# Patient Record
Sex: Male | Born: 1950
Health system: Southern US, Community
[De-identification: ages and names within clinical notes are randomized; demographics above are authoritative.]

## PROBLEM LIST (undated history)

## (undated) DIAGNOSIS — I509 Heart failure, unspecified: Secondary | ICD-10-CM

## (undated) DIAGNOSIS — I10 Essential (primary) hypertension: Secondary | ICD-10-CM

## (undated) DIAGNOSIS — M109 Gout, unspecified: Secondary | ICD-10-CM

## (undated) DIAGNOSIS — E785 Hyperlipidemia, unspecified: Secondary | ICD-10-CM

## (undated) DIAGNOSIS — R011 Cardiac murmur, unspecified: Secondary | ICD-10-CM

## (undated) HISTORY — DX: Hyperlipidemia, unspecified: E78.5

## (undated) HISTORY — DX: Cardiac murmur, unspecified: R01.1

## (undated) HISTORY — PX: NO PAST SURGERIES: SHX2092

## (undated) HISTORY — DX: Essential (primary) hypertension: I10

---

## 1998-04-01 ENCOUNTER — Other Ambulatory Visit: Admission: RE | Admit: 1998-04-01 | Discharge: 1998-04-01 | Payer: Self-pay | Admitting: Family Medicine

## 2004-06-16 ENCOUNTER — Ambulatory Visit: Payer: Self-pay | Admitting: *Deleted

## 2004-07-07 ENCOUNTER — Ambulatory Visit: Payer: Self-pay | Admitting: Nurse Practitioner

## 2004-07-15 ENCOUNTER — Ambulatory Visit: Payer: Self-pay | Admitting: *Deleted

## 2004-08-29 ENCOUNTER — Ambulatory Visit: Payer: Self-pay | Admitting: Nurse Practitioner

## 2005-04-21 ENCOUNTER — Ambulatory Visit: Payer: Self-pay | Admitting: Nurse Practitioner

## 2005-04-27 ENCOUNTER — Ambulatory Visit: Payer: Self-pay | Admitting: Nurse Practitioner

## 2005-05-04 ENCOUNTER — Ambulatory Visit: Payer: Self-pay | Admitting: Nurse Practitioner

## 2005-05-11 ENCOUNTER — Ambulatory Visit: Payer: Self-pay | Admitting: Nurse Practitioner

## 2005-06-03 ENCOUNTER — Ambulatory Visit: Payer: Self-pay | Admitting: Nurse Practitioner

## 2005-06-17 ENCOUNTER — Ambulatory Visit: Payer: Self-pay | Admitting: Nurse Practitioner

## 2005-11-09 ENCOUNTER — Ambulatory Visit: Payer: Self-pay | Admitting: Nurse Practitioner

## 2006-04-12 ENCOUNTER — Ambulatory Visit: Payer: Self-pay | Admitting: Nurse Practitioner

## 2006-04-16 ENCOUNTER — Encounter: Admission: RE | Admit: 2006-04-16 | Discharge: 2006-04-16 | Payer: Self-pay | Admitting: Family Medicine

## 2006-04-20 ENCOUNTER — Encounter: Admission: RE | Admit: 2006-04-20 | Discharge: 2006-04-20 | Payer: Self-pay | Admitting: Family Medicine

## 2006-09-01 ENCOUNTER — Ambulatory Visit: Payer: Self-pay | Admitting: Nurse Practitioner

## 2006-09-30 ENCOUNTER — Ambulatory Visit: Payer: Self-pay | Admitting: Nurse Practitioner

## 2006-10-13 ENCOUNTER — Ambulatory Visit: Payer: Self-pay | Admitting: Nurse Practitioner

## 2006-10-15 IMAGING — CR DG ANKLE COMPLETE 3+V*R*
3 series · 3 of 3 positions shown · non-contrast
Comparison: none

CLINICAL DATA: Pain and swelling with decreased range of motion. 
RIGHT ANKLE:

[view not recorded (1 of 3)]
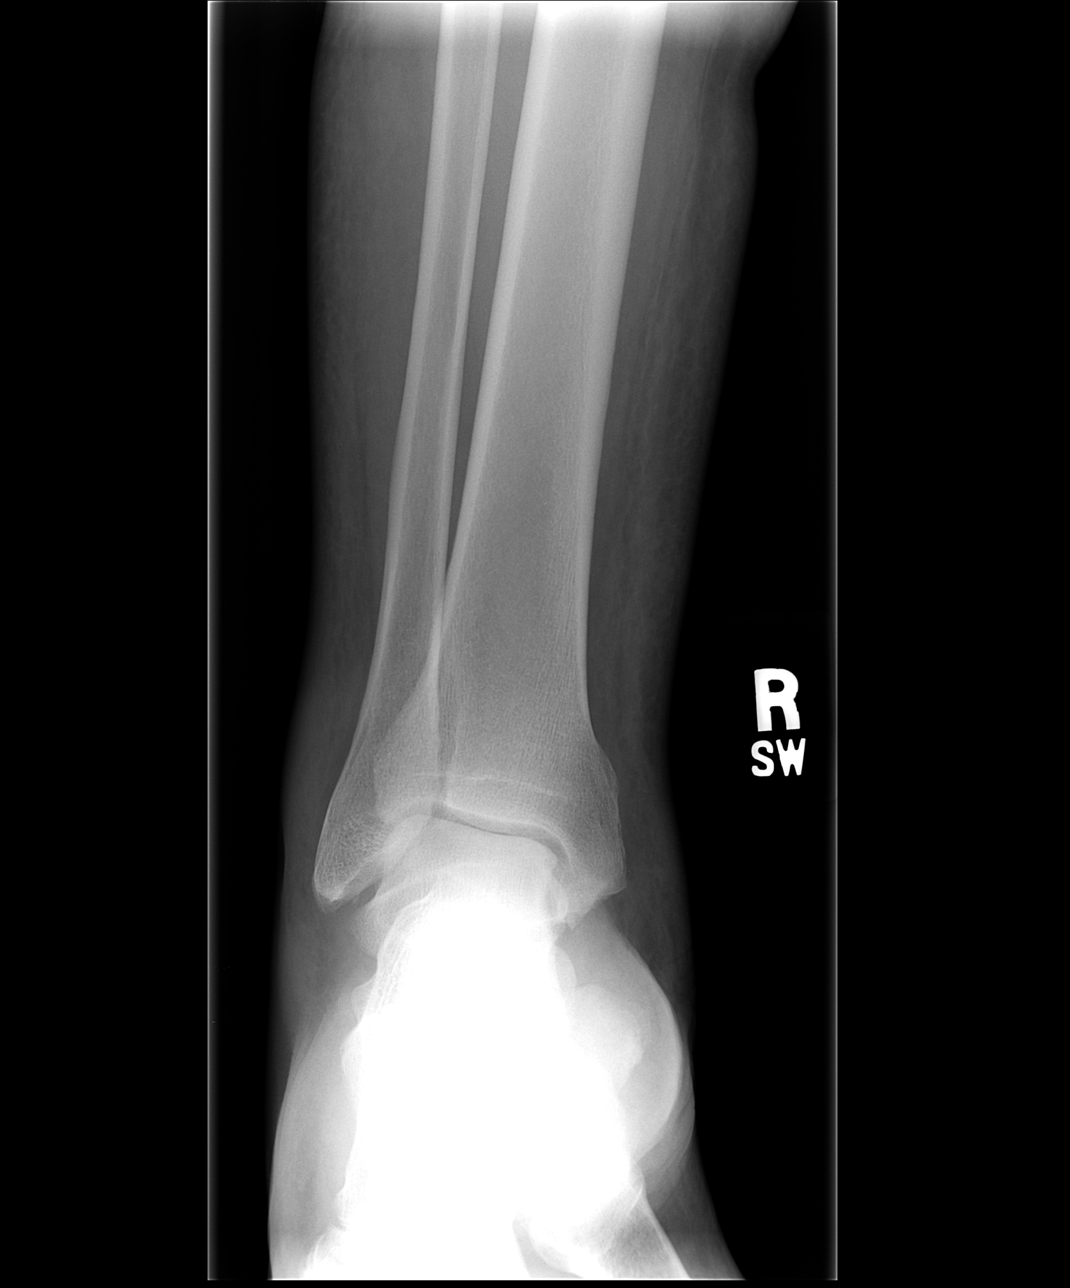

[view not recorded (2 of 3)]
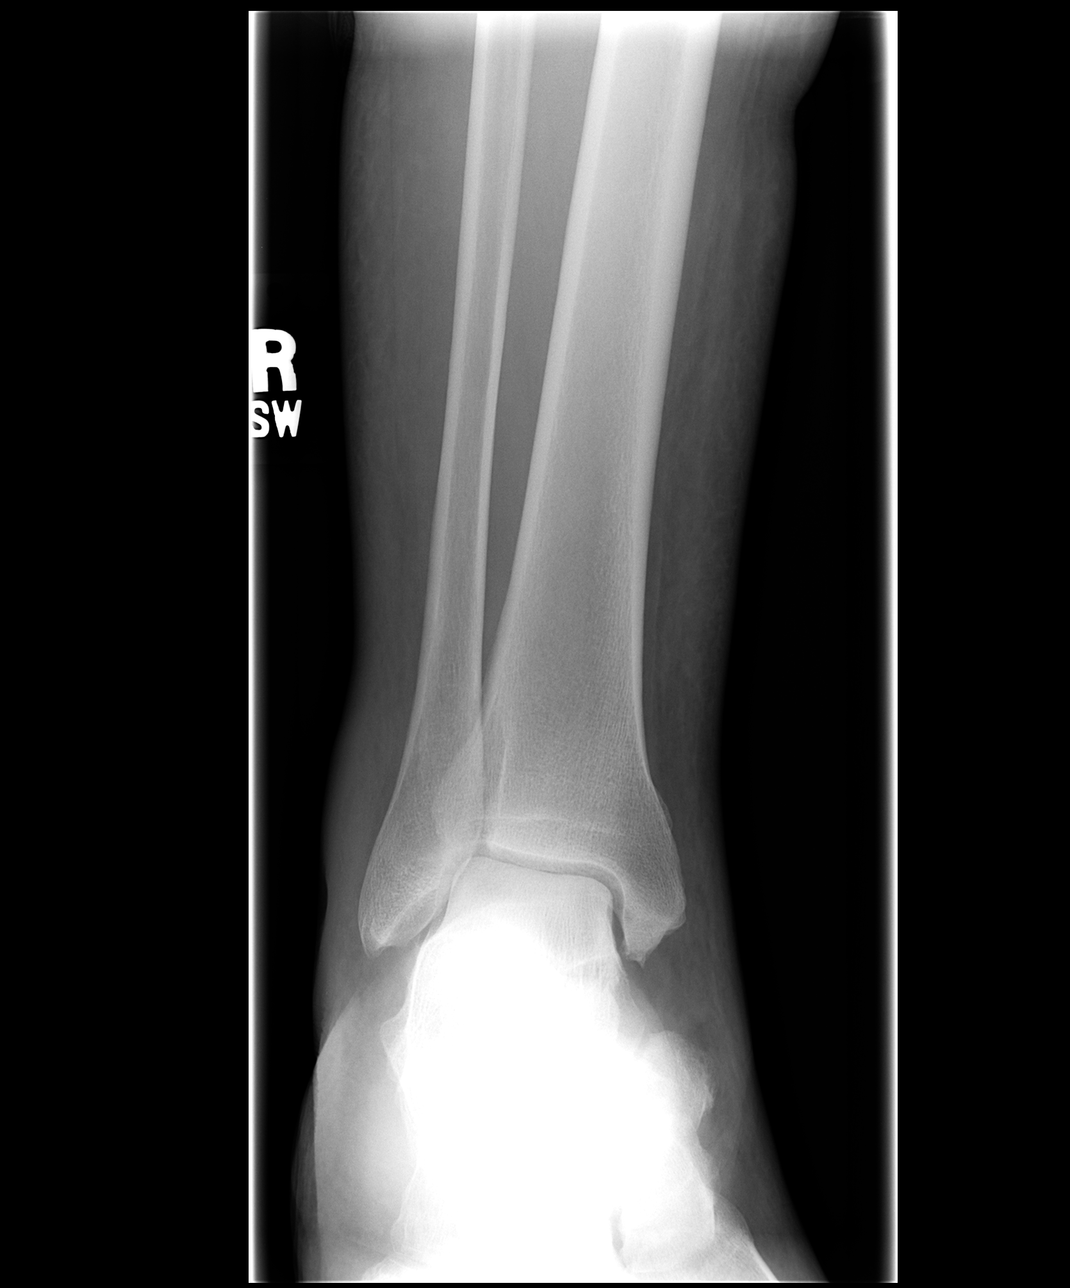

[view not recorded (3 of 3)]
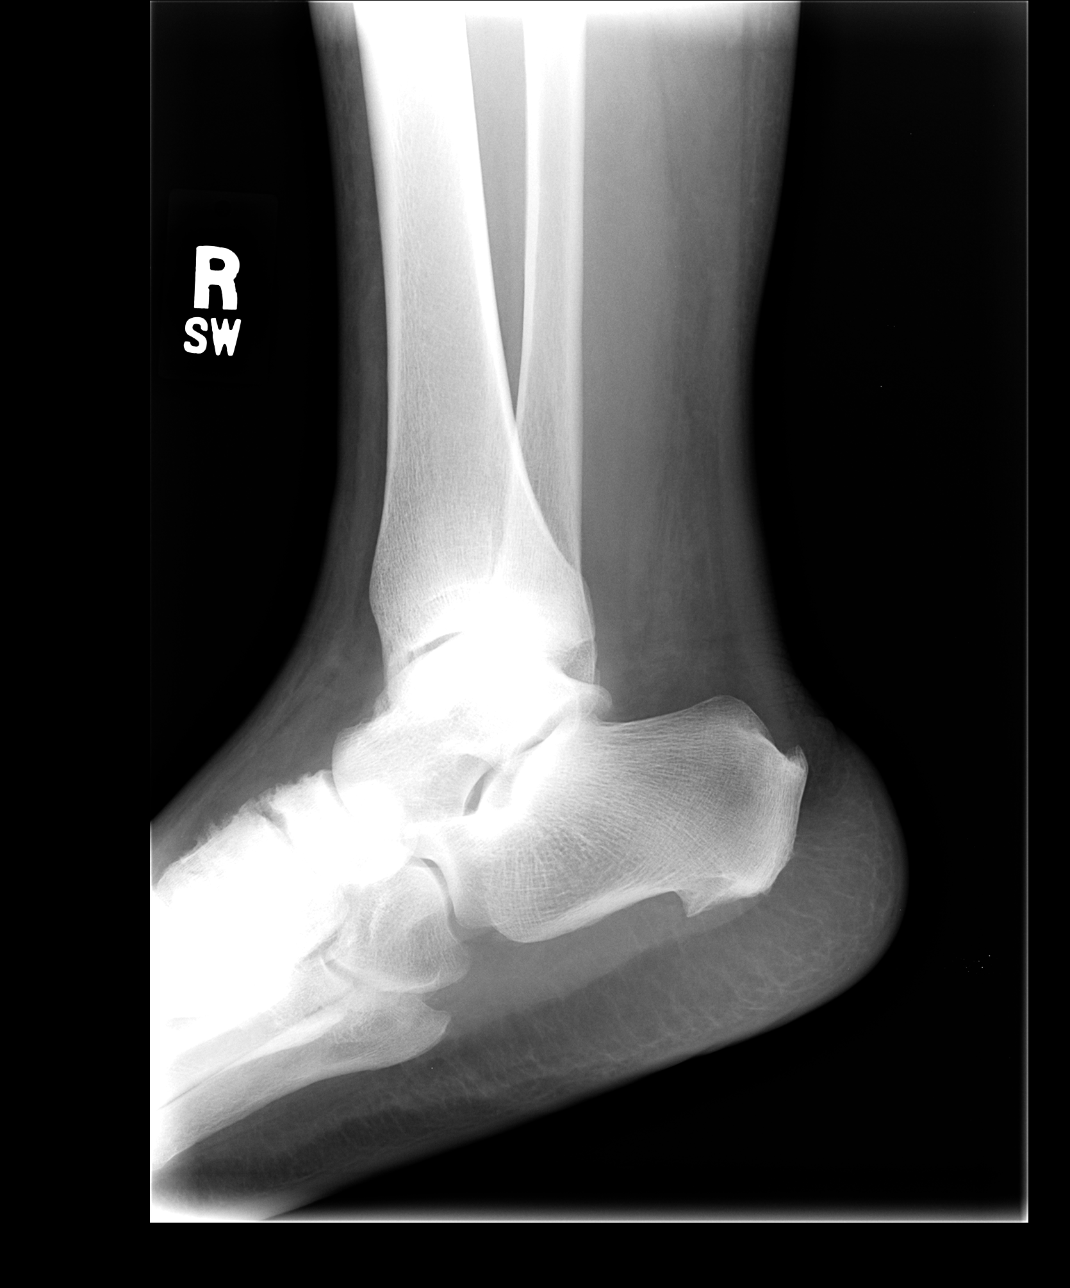

[3 of 3 positions shown; findings below may reference images not displayed]

FINDINGS: Three views of the ankle demonstrate soft tissue swelling particularly along the lateral malleolus.  Negative for acute fracture or dislocation.  Spurring or osteophyte formation along the medial malleolus.  There is also mild spurring near the insertion of the Achilles tendon and along the plantar aspect of the calcaneus.  Irregularity along the dorsal aspect of the tarsal bones is likely degenerative.
IMPRESSION: 1.  Soft tissue swelling without acute bone abnormality. 
2.  Degenerative changes in the foot.

## 2007-06-08 ENCOUNTER — Encounter (INDEPENDENT_AMBULATORY_CARE_PROVIDER_SITE_OTHER): Payer: Self-pay | Admitting: *Deleted

## 2007-09-08 ENCOUNTER — Ambulatory Visit: Payer: Self-pay | Admitting: Internal Medicine

## 2007-10-05 ENCOUNTER — Ambulatory Visit: Payer: Self-pay | Admitting: Family Medicine

## 2007-10-19 ENCOUNTER — Ambulatory Visit: Payer: Self-pay | Admitting: Family Medicine

## 2007-10-19 ENCOUNTER — Encounter (INDEPENDENT_AMBULATORY_CARE_PROVIDER_SITE_OTHER): Payer: Self-pay | Admitting: Family Medicine

## 2007-10-19 LAB — CONVERTED CEMR LAB
ALT: 29 units/L (ref 0–53)
AST: 17 units/L (ref 0–37)
Albumin: 4.4 g/dL (ref 3.5–5.2)
Alcohol, Ethyl (B): <5 mg/dL (ref 0–10)
Alkaline Phosphatase: 51 units/L (ref 39–117)
Amphetamine Screen, Ur: NEGATIVE
BUN: 16 mg/dL (ref 6–23)
Barbiturate Quant, Ur: NEGATIVE
Benzodiazepines.: NEGATIVE
CO2: 27 meq/L (ref 19–32)
Calcium: 9.8 mg/dL (ref 8.4–10.5)
Chloride: 106 meq/L (ref 96–112)
Cocaine Metabolites: NEGATIVE
Creatinine, Ser: 0.84 mg/dL (ref 0.40–1.50)
Creatinine,U: 82.7 mg/dL
Glucose, Bld: 122 mg/dL — ABNORMAL HIGH (ref 70–99)
Marijuana Metabolite: NEGATIVE
Methadone: NEGATIVE
Opiate Screen, Urine: NEGATIVE
Phencyclidine (PCP): NEGATIVE
Potassium: 4.2 meq/L (ref 3.5–5.3)
Propoxyphene: NEGATIVE
Sodium: 144 meq/L (ref 135–145)
Total Bilirubin: 0.6 mg/dL (ref 0.3–1.2)
Total Protein: 7 g/dL (ref 6.0–8.3)

## 2007-12-21 ENCOUNTER — Ambulatory Visit: Payer: Self-pay | Admitting: Family Medicine

## 2008-06-14 ENCOUNTER — Ambulatory Visit: Payer: Self-pay | Admitting: Internal Medicine

## 2008-11-28 ENCOUNTER — Encounter (INDEPENDENT_AMBULATORY_CARE_PROVIDER_SITE_OTHER): Payer: Self-pay | Admitting: Internal Medicine

## 2008-11-28 ENCOUNTER — Encounter: Payer: Self-pay | Admitting: Internal Medicine

## 2008-11-28 ENCOUNTER — Ambulatory Visit: Payer: Self-pay | Admitting: Internal Medicine

## 2008-11-28 LAB — CONVERTED CEMR LAB
ALT: 28 units/L (ref 0–53)
AST: 20 units/L (ref 0–37)
Albumin: 4.3 g/dL (ref 3.5–5.2)
Alkaline Phosphatase: 65 units/L (ref 39–117)
BUN: 14 mg/dL (ref 6–23)
CO2: 23 meq/L (ref 19–32)
Calcium: 9.1 mg/dL (ref 8.4–10.5)
Chloride: 107 meq/L (ref 96–112)
Creatinine, Ser: 0.73 mg/dL (ref 0.40–1.50)
Glucose, Bld: 141 mg/dL — ABNORMAL HIGH (ref 70–99)
Potassium: 3.9 meq/L (ref 3.5–5.3)
Sodium: 142 meq/L (ref 135–145)
Total Bilirubin: 0.5 mg/dL (ref 0.3–1.2)
Total Protein: 7.1 g/dL (ref 6.0–8.3)

## 2008-11-30 ENCOUNTER — Ambulatory Visit (HOSPITAL_COMMUNITY): Admission: RE | Admit: 2008-11-30 | Discharge: 2008-11-30 | Payer: Self-pay | Admitting: Internal Medicine

## 2008-12-12 ENCOUNTER — Encounter (INDEPENDENT_AMBULATORY_CARE_PROVIDER_SITE_OTHER): Payer: Self-pay | Admitting: Adult Health

## 2008-12-12 ENCOUNTER — Ambulatory Visit: Payer: Self-pay | Admitting: Internal Medicine

## 2008-12-12 LAB — CONVERTED CEMR LAB
Cholesterol: 168 mg/dL (ref 0–200)
HDL: 49 mg/dL (ref >39)
LDL Cholesterol: 101 mg/dL — ABNORMAL HIGH (ref 0–99)
Total CHOL/HDL Ratio: 3.4
Triglycerides: 89 mg/dL (ref <150)
VLDL: 18 mg/dL (ref 0–40)

## 2009-02-01 ENCOUNTER — Other Ambulatory Visit: Admission: RE | Admit: 2009-02-01 | Discharge: 2009-02-01 | Payer: Self-pay | Admitting: Internal Medicine

## 2009-02-01 ENCOUNTER — Ambulatory Visit: Payer: Self-pay | Admitting: Internal Medicine

## 2009-02-22 ENCOUNTER — Ambulatory Visit: Payer: Self-pay | Admitting: Internal Medicine

## 2009-03-14 ENCOUNTER — Ambulatory Visit: Payer: Self-pay | Admitting: Internal Medicine

## 2009-03-14 ENCOUNTER — Encounter: Payer: Self-pay | Admitting: Internal Medicine

## 2009-04-10 DIAGNOSIS — E1159 Type 2 diabetes mellitus with other circulatory complications: Secondary | ICD-10-CM | POA: Insufficient documentation

## 2009-04-10 DIAGNOSIS — I10 Essential (primary) hypertension: Secondary | ICD-10-CM | POA: Insufficient documentation

## 2009-04-13 ENCOUNTER — Encounter: Payer: Self-pay | Admitting: Internal Medicine

## 2009-05-10 DIAGNOSIS — I498 Other specified cardiac arrhythmias: Secondary | ICD-10-CM | POA: Insufficient documentation

## 2009-06-20 ENCOUNTER — Encounter (INDEPENDENT_AMBULATORY_CARE_PROVIDER_SITE_OTHER): Payer: Self-pay | Admitting: Internal Medicine

## 2009-06-20 ENCOUNTER — Ambulatory Visit: Payer: Self-pay | Admitting: Internal Medicine

## 2009-06-20 LAB — CONVERTED CEMR LAB
ALT: 30 units/L (ref 0–53)
AST: 19 units/L (ref 0–37)
Albumin: 4.5 g/dL (ref 3.5–5.2)
Alkaline Phosphatase: 60 units/L (ref 39–117)
BUN: 10 mg/dL (ref 6–23)
CO2: 24 meq/L (ref 19–32)
Calcium: 9.5 mg/dL (ref 8.4–10.5)
Chloride: 103 meq/L (ref 96–112)
Cholesterol: 149 mg/dL (ref 0–200)
Creatinine, Ser: 0.68 mg/dL (ref 0.40–1.50)
Glucose, Bld: 122 mg/dL — ABNORMAL HIGH (ref 70–99)
HDL: 41 mg/dL (ref >39)
LDL Cholesterol: 83 mg/dL (ref 0–99)
Potassium: 3.7 meq/L (ref 3.5–5.3)
Sodium: 143 meq/L (ref 135–145)
Total Bilirubin: 0.4 mg/dL (ref 0.3–1.2)
Total CHOL/HDL Ratio: 3.6
Total Protein: 7.3 g/dL (ref 6.0–8.3)
Triglycerides: 125 mg/dL (ref <150)
VLDL: 25 mg/dL (ref 0–40)

## 2009-07-11 ENCOUNTER — Ambulatory Visit: Payer: Self-pay | Admitting: Internal Medicine

## 2010-08-13 ENCOUNTER — Encounter (INDEPENDENT_AMBULATORY_CARE_PROVIDER_SITE_OTHER): Payer: Self-pay | Admitting: *Deleted

## 2010-08-13 LAB — CONVERTED CEMR LAB: Microalb, Ur: 3.68 mg/dL — ABNORMAL HIGH (ref 0.00–1.89)

## 2010-09-03 ENCOUNTER — Ambulatory Visit: Admit: 2010-09-03 | Payer: Self-pay | Admitting: Family Medicine

## 2010-09-03 ENCOUNTER — Ambulatory Visit (HOSPITAL_COMMUNITY): Admission: RE | Admit: 2010-09-03 | Payer: Self-pay | Source: Home / Self Care | Admitting: Family Medicine

## 2010-09-18 ENCOUNTER — Encounter (INDEPENDENT_AMBULATORY_CARE_PROVIDER_SITE_OTHER): Payer: Self-pay | Admitting: *Deleted

## 2010-09-18 LAB — CONVERTED CEMR LAB
ALT: 15 units/L (ref 0–53)
AST: 16 units/L (ref 0–37)
Albumin: 4.7 g/dL (ref 3.5–5.2)
Alkaline Phosphatase: 55 units/L (ref 39–117)
BUN: 11 mg/dL (ref 6–23)
CO2: 27 meq/L (ref 19–32)
Calcium: 10.3 mg/dL (ref 8.4–10.5)
Chloride: 103 meq/L (ref 96–112)
Cholesterol: 116 mg/dL (ref 0–200)
Creatinine, Ser: 0.67 mg/dL (ref 0.40–1.50)
Glucose, Bld: 140 mg/dL — ABNORMAL HIGH (ref 70–99)
HDL: 39 mg/dL — ABNORMAL LOW (ref >39)
LDL Cholesterol: 64 mg/dL (ref 0–99)
PSA: 0.51 ng/mL (ref <=4.00)
Potassium: 4.3 meq/L (ref 3.5–5.3)
Sodium: 141 meq/L (ref 135–145)
Total Bilirubin: 0.5 mg/dL (ref 0.3–1.2)
Total CHOL/HDL Ratio: 3
Total Protein: 7.5 g/dL (ref 6.0–8.3)
Triglycerides: 66 mg/dL (ref <150)
VLDL: 13 mg/dL (ref 0–40)

## 2010-10-12 ENCOUNTER — Encounter: Payer: Self-pay | Admitting: Family Medicine

## 2010-10-23 NOTE — Consult Note (Signed)
Summary: [facility] Ref[facility]  [facility] Ref[facility]   Imported By: Roderic Ovens 04/30/2009 14:55:07  _____________________________________________________________________  External Attachment:    Type:   Image     Comment:   External Document

## 2010-10-23 NOTE — Progress Notes (Signed)
Summary: [facility]  [facility]   Imported By: Roderic Ovens 04/30/2009 15:05:39  _____________________________________________________________________  External Attachment:    Type:   Image     Comment:   External Document

## 2010-10-23 NOTE — Miscellaneous (Signed)
Summary: VIP  Patient: Seth Sanders [facility] Note: All result statuses are Final unless otherwise noted.  Tests: (1) VIP (Medications)   LLIMPORTMEDS              "Result Below..."       RESULT: MONOPRIL TABS 10 MG*TAKE TWO (2) TABLETS BY MOUTH DAILY*05/12/2007*Last Refill: 06/09/2007*[id number]*******   LLIMPORTMEDS              "Result Below..."       RESULT: INDOMETHACIN CAPS 25 MG*TAKE ONE (1) CAPSULE THREE (3) TIMES EACH  DAY AS NEEDED*09/01/2006*Last Refill: 06/06/2007*[id number]*******   LLIMPORTMEDS              "Result Below..."       RESULT: HYDROCHLOROTHIAZIDE TABS 25 MG*TAKE ONE (1) TABLET EACH DAY*04/11/2007*Last Refill: 06/09/2007*[id number]*******   LLIMPORTMEDS              "Result Below..."       RESULT: COLCHICINE TABS 0.6 MG*TAKE ONE (1) TABLET TWICE DAILY*09/01/2006*Last Refill: 12/10/2006*[id number]*******   LLIMPORTMEDS              "Result Below..."       RESULT: CLONIDINE HCL TABS 0.2 MG*TAKE ONE (1) TABLET THREE TIMES DAILY*04/11/2007*Last Refill: 06/09/2007*[id number]*******   LLIMPORTMEDS              "Result Below..."       RESULT: CALAN SR TBCR 180 MG*TAKE TWO (2) TABLETS BY MOUTH DAILY*04/11/2007*Last Refill: 06/09/2007*[id number]*******   LLIMPORTMEDS              "Result Below..."       RESULT: ATENOLOL TABS 50 MG*TAKE ONE (1) TABLET EACH DAY*04/11/2007*Last Refill: 06/09/2007*2012*******   LLIMPORTMEDS              "Result Below..."       RESULT: ALLOPURINOL TABS 300 MG*TAKE ONE (1) TABLET EACH DAY*09/01/2006*Last Refill: 06/06/2007*[id number]*******   LLIMPORTALLS              NKDA***  Note: An exclamation mark (!) indicates a result that was not dispersed into the flowsheet. Document Creation Date: 07/21/2007 3:06 PM _______________________________________________________________________  (1) Order result status: Final Collection or observation date-time: 06/08/2007 Requested date-time: 06/08/2007 Receipt date-time:  Reported date-time: 06/08/2007 Referring Physician:   Ordering Physician:    Specimen Source:  Source: [place] Order Number:  Lab site:

## 2011-08-25 ENCOUNTER — Ambulatory Visit (HOSPITAL_BASED_OUTPATIENT_CLINIC_OR_DEPARTMENT_OTHER): Payer: Self-pay

## 2012-03-30 ENCOUNTER — Ambulatory Visit (HOSPITAL_COMMUNITY): Admission: RE | Admit: 2012-03-30 | Payer: Self-pay | Source: Ambulatory Visit

## 2012-04-06 ENCOUNTER — Ambulatory Visit (HOSPITAL_COMMUNITY): Admission: RE | Admit: 2012-04-06 | Payer: Self-pay | Source: Ambulatory Visit

## 2012-04-12 ENCOUNTER — Ambulatory Visit (HOSPITAL_COMMUNITY)
Admission: RE | Admit: 2012-04-12 | Discharge: 2012-04-12 | Disposition: A | Discharge status: Home / Self Care | Payer: Self-pay | Source: Ambulatory Visit | Attending: Family Medicine | Admitting: Family Medicine

## 2012-04-12 DIAGNOSIS — I10 Essential (primary) hypertension: Secondary | ICD-10-CM | POA: Insufficient documentation

## 2012-04-12 DIAGNOSIS — R011 Cardiac murmur, unspecified: Secondary | ICD-10-CM | POA: Insufficient documentation

## 2012-04-12 DIAGNOSIS — I079 Rheumatic tricuspid valve disease, unspecified: Secondary | ICD-10-CM | POA: Insufficient documentation

## 2012-04-12 DIAGNOSIS — F17200 Nicotine dependence, unspecified, uncomplicated: Secondary | ICD-10-CM | POA: Insufficient documentation

## 2012-04-12 DIAGNOSIS — E119 Type 2 diabetes mellitus without complications: Secondary | ICD-10-CM | POA: Insufficient documentation

## 2012-09-08 ENCOUNTER — Ambulatory Visit (INDEPENDENT_AMBULATORY_CARE_PROVIDER_SITE_OTHER): Payer: Self-pay | Admitting: Family Medicine

## 2012-09-08 ENCOUNTER — Encounter: Payer: Self-pay | Admitting: Family Medicine

## 2012-09-08 VITALS — BP 182/100 | HR 92 | Temp 99.3°F | Ht 72.0 in | Wt 295.0 lb

## 2012-09-08 DIAGNOSIS — E785 Hyperlipidemia, unspecified: Secondary | ICD-10-CM | POA: Insufficient documentation

## 2012-09-08 DIAGNOSIS — E1169 Type 2 diabetes mellitus with other specified complication: Secondary | ICD-10-CM | POA: Insufficient documentation

## 2012-09-08 DIAGNOSIS — I10 Essential (primary) hypertension: Secondary | ICD-10-CM

## 2012-09-08 DIAGNOSIS — I509 Heart failure, unspecified: Secondary | ICD-10-CM

## 2012-09-08 DIAGNOSIS — E118 Type 2 diabetes mellitus with unspecified complications: Secondary | ICD-10-CM | POA: Insufficient documentation

## 2012-09-08 DIAGNOSIS — E119 Type 2 diabetes mellitus without complications: Secondary | ICD-10-CM

## 2012-09-08 DIAGNOSIS — I5030 Unspecified diastolic (congestive) heart failure: Secondary | ICD-10-CM | POA: Insufficient documentation

## 2012-09-08 LAB — POCT GLYCOSYLATED HEMOGLOBIN (HGB A1C): Hemoglobin A1C: 5.6

## 2012-09-08 MED ORDER — LISINOPRIL 40 MG PO TABS: 40.0000 mg | ORAL_TABLET | Freq: Every day | ORAL | Status: DC

## 2012-09-08 MED ORDER — PRAVASTATIN SODIUM 20 MG PO TABS: 20.0000 mg | ORAL_TABLET | Freq: Every day | ORAL | Status: DC

## 2012-09-08 MED ORDER — CLONIDINE HCL 0.3 MG PO TABS: 0.3000 mg | ORAL_TABLET | Freq: Two times a day (BID) | ORAL | Status: DC

## 2012-09-08 MED ORDER — ASPIRIN 81 MG PO TBEC: 81.0000 mg | DELAYED_RELEASE_TABLET | Freq: Every day | ORAL | Status: DC

## 2012-09-08 MED ORDER — METFORMIN HCL 1000 MG PO TABS: 1000.0000 mg | ORAL_TABLET | Freq: Two times a day (BID) | ORAL | Status: DC

## 2012-09-08 MED ORDER — DILTIAZEM HCL ER COATED BEADS 300 MG PO CP24: 300.0000 mg | ORAL_CAPSULE | Freq: Every day | ORAL | Status: DC

## 2012-09-08 NOTE — Assessment & Plan Note (Addendum)
BP today elevated. Pt out of meds for 10 days. Asymptomatic. Pt denies history of cardiac arhythmia, although listed on his problem there is bradycardia and on what I could see on EMR today there was mentioned tachycardia. Pt denies Hx of Gout but has been on allopurinol, indomethacin and Colchicine that supports this diagnosis. Pt reports metoprolol made have "seizures" in 1990.  Meds brought in bottle: Diltiazem 300 and 120 mg. Clonidine 0.3 TID. Lisinopril 40 mg daily.  Plan: We will refill medications except diltiazem 120mg . There are different frequency of Clonidine, will prescribe BID.  Discussed BP log. Will continue close f/u and labs on his next appointment.

## 2012-09-08 NOTE — Patient Instructions (Addendum)
It has been a pleasure to see you today. Please take the medications as prescribed. Make your next appointment in January/2014. Please remember to bring your prior medical records.

## 2012-09-08 NOTE — Assessment & Plan Note (Signed)
On pravastatin 20 mg. Will continue it. Lipid profile on his next appointment.

## 2012-09-08 NOTE — Progress Notes (Signed)
Family Medicine Office Visit Note   Subjective:   Patient ID: Seth Sanders, male  DOB: Oct 15, 1950, 61 y.o.. MRN: [id number]   Pt that comes today to establish care. He was priorly followed up by [facility].  He has no complaints today, but he states that he has been out of his medications for about 10 days. He went to another doctor before here and Hydralazine was added. Pt reports that this medication made him fell dizzy and he stopped taking it as well. I will address his medical problems individually  under problem list. Pt agreeable to bring medical records.  Review of Systems:  Pt denies SOB, chest pain, palpitations, headaches, dizziness, numbness or weakness. No changes on urinary or BM habits. No unintentional weigh loss/gain.  Objective:   Physical Exam: Gen:  NAD HEENT: Moist mucous membranes  CV: Regular rate and rhythm. III/VI systolic murmur best heard on aortic foci.  PULM: Clear to auscultation bilaterally. No wheezes/rales/rhonchi ABD: Soft, non tender, non distended, normal bowel sounds* EXT: No edema Neuro: Alert and oriented x3. No focalization  Assessment & Plan:

## 2012-09-08 NOTE — Assessment & Plan Note (Addendum)
A1C 5.6 at goal. Will continue current regimen of metformin 1000 BID

## 2012-09-08 NOTE — Assessment & Plan Note (Signed)
No signs of fluid overload. Plan: BP control.

## 2012-09-13 ENCOUNTER — Emergency Department (HOSPITAL_COMMUNITY)
Admission: EM | Admit: 2012-09-13 | Discharge: 2012-09-13 | Disposition: A | Discharge status: Home / Self Care | Payer: Self-pay | Attending: Emergency Medicine | Admitting: Emergency Medicine

## 2012-09-13 ENCOUNTER — Encounter (HOSPITAL_COMMUNITY): Payer: Self-pay | Admitting: Emergency Medicine

## 2012-09-13 ENCOUNTER — Emergency Department (HOSPITAL_COMMUNITY): Payer: Self-pay

## 2012-09-13 DIAGNOSIS — F17200 Nicotine dependence, unspecified, uncomplicated: Secondary | ICD-10-CM | POA: Insufficient documentation

## 2012-09-13 DIAGNOSIS — I509 Heart failure, unspecified: Secondary | ICD-10-CM | POA: Insufficient documentation

## 2012-09-13 DIAGNOSIS — Z79899 Other long term (current) drug therapy: Secondary | ICD-10-CM | POA: Insufficient documentation

## 2012-09-13 DIAGNOSIS — E785 Hyperlipidemia, unspecified: Secondary | ICD-10-CM | POA: Insufficient documentation

## 2012-09-13 DIAGNOSIS — E119 Type 2 diabetes mellitus without complications: Secondary | ICD-10-CM | POA: Insufficient documentation

## 2012-09-13 DIAGNOSIS — R011 Cardiac murmur, unspecified: Secondary | ICD-10-CM | POA: Insufficient documentation

## 2012-09-13 DIAGNOSIS — I10 Essential (primary) hypertension: Secondary | ICD-10-CM | POA: Insufficient documentation

## 2012-09-13 DIAGNOSIS — Z7982 Long term (current) use of aspirin: Secondary | ICD-10-CM | POA: Insufficient documentation

## 2012-09-13 LAB — CBC WITH DIFFERENTIAL/PLATELET
Basophils Absolute: 0.1 10*3/uL (ref 0.0–0.1)
Basophils Relative: 1 % (ref 0–1)
Eosinophils Absolute: 0.1 10*3/uL (ref 0.0–0.7)
Eosinophils Relative: 1 % (ref 0–5)
HCT: 30.1 % — ABNORMAL LOW (ref 39.0–52.0)
Hemoglobin: 9.5 g/dL — ABNORMAL LOW (ref 13.0–17.0)
Lymphocytes Relative: 19 % (ref 12–46)
Lymphs Abs: 2.2 10*3/uL (ref 0.7–4.0)
MCH: 26.8 pg (ref 26.0–34.0)
MCHC: 31.6 g/dL (ref 30.0–36.0)
MCV: 84.8 fL (ref 78.0–100.0)
Monocytes Absolute: 0.4 10*3/uL (ref 0.1–1.0)
Monocytes Relative: 4 % (ref 3–12)
Neutro Abs: 8.9 10*3/uL — ABNORMAL HIGH (ref 1.7–7.7)
Neutrophils Relative %: 76 % (ref 43–77)
Platelets: 236 10*3/uL (ref 150–400)
RBC: 3.55 MIL/uL — ABNORMAL LOW (ref 4.22–5.81)
RDW: 13.9 % (ref 11.5–15.5)
WBC: 11.6 10*3/uL — ABNORMAL HIGH (ref 4.0–10.5)

## 2012-09-13 LAB — COMPREHENSIVE METABOLIC PANEL
ALT: 37 U/L (ref 0–53)
AST: 22 U/L (ref 0–37)
Albumin: 3.8 g/dL (ref 3.5–5.2)
Alkaline Phosphatase: 64 U/L (ref 39–117)
BUN: 13 mg/dL (ref 6–23)
CO2: 23 mEq/L (ref 19–32)
Calcium: 9.5 mg/dL (ref 8.4–10.5)
Chloride: 108 mEq/L (ref 96–112)
Creatinine, Ser: 0.79 mg/dL (ref 0.50–1.35)
GFR calc Af Amer: >90 mL/min (ref >90)
GFR calc non Af Amer: >90 mL/min (ref >90)
Glucose, Bld: 129 mg/dL — ABNORMAL HIGH (ref 70–99)
Potassium: 3.6 mEq/L (ref 3.5–5.1)
Sodium: 143 mEq/L (ref 135–145)
Total Bilirubin: 0.2 mg/dL — ABNORMAL LOW (ref 0.3–1.2)
Total Protein: 7.3 g/dL (ref 6.0–8.3)

## 2012-09-13 LAB — URINALYSIS, ROUTINE W REFLEX MICROSCOPIC
Bilirubin Urine: NEGATIVE
Glucose, UA: NEGATIVE mg/dL
Hgb urine dipstick: NEGATIVE
Ketones, ur: NEGATIVE mg/dL
Leukocytes, UA: NEGATIVE
Nitrite: NEGATIVE
Protein, ur: NEGATIVE mg/dL
Specific Gravity, Urine: 1.007 (ref 1.005–1.030)
Urobilinogen, UA: 0.2 mg/dL (ref 0.0–1.0)
pH: 7 (ref 5.0–8.0)

## 2012-09-13 LAB — TROPONIN I: Troponin I: <0.3 ng/mL (ref <0.30)

## 2012-09-13 MED ORDER — SODIUM CHLORIDE 0.9 % IV SOLN
INTRAVENOUS | Status: DC
  Administered 2012-09-13: 10:00:00 via INTRAVENOUS

## 2012-09-13 MED ORDER — FUROSEMIDE 20 MG PO TABS: 20.0000 mg | ORAL_TABLET | Freq: Two times a day (BID) | ORAL | Status: DC

## 2012-09-13 MED ORDER — FUROSEMIDE 10 MG/ML IJ SOLN
80.0000 mg | Freq: Once | INTRAMUSCULAR | Status: AC
  Administered 2012-09-13: 80 mg via INTRAVENOUS

## 2012-09-13 MED ORDER — FUROSEMIDE 10 MG/ML IJ SOLN
INTRAMUSCULAR | Status: AC
  Filled 2012-09-13: qty 8

## 2012-09-13 NOTE — ED Provider Notes (Signed)
History     CSN: [id number]  Arrival date & time 09/13/12  0801   First MD Initiated Contact with Patient 09/13/12 [phone]      Chief Complaint  Patient presents with  . Shortness of Breath    (Consider location/radiation/quality/duration/timing/severity/associated sxs/prior treatment) The history is provided by the patient and the EMS personnel.   patient brought in by EMS for worsening shortness of breath over the past 3 days. severe today. patient could not lay down had these remain sitting up. Patient was tripoding some difficulty with speech did have labored respirations upon arrival. EMS had given albuterol inhaler without any effect. Patient followed by family practice Center. Patient denied any chest pain just the shortness of breath.  Past Medical History  Diagnosis Date  . Hypertension   . Hyperlipidemia   . Heart murmur   . Diabetes mellitus without complication     History reviewed. No pertinent past surgical history.  History reviewed. No pertinent family history.  History  Substance Use Topics  . Smoking status: Current Every Day Smoker -- 0.5 packs/day    Types: Cigarettes  . Smokeless tobacco: Not on file  . Alcohol Use: No      Review of Systems  Constitutional: Negative for fever.  HENT: Negative for congestion.   Eyes: Negative for redness.  Respiratory: Positive for shortness of breath.   Cardiovascular: Negative for chest pain.  Gastrointestinal: Negative for nausea, vomiting and abdominal pain.  Genitourinary: Negative for dysuria.  Musculoskeletal: Negative for back pain.  Neurological: Negative for headaches.  Hematological: Does not bruise/bleed easily.    Allergies  Review of patient's allergies indicates no known allergies.  Home Medications   Current Outpatient Rx  Name  Route  Sig  Dispense  Refill  . ASPIRIN 81 MG PO TBEC   Oral   Take 81 mg by mouth daily. Swallow whole.         Marland Kitchen CLONIDINE HCL 0.3 MG PO TABS   Oral  Take 0.3 mg by mouth 2 (two) times daily.         Marland Kitchen DILTIAZEM HCL ER COATED BEADS 300 MG PO CP24   Oral   Take 300 mg by mouth daily.         Marland Kitchen LISINOPRIL 40 MG PO TABS   Oral   Take 40 mg by mouth daily.         Marland Kitchen METFORMIN HCL 1000 MG PO TABS   Oral   Take 1,000 mg by mouth 2 (two) times daily with a meal.         . PRAVASTATIN SODIUM 20 MG PO TABS   Oral   Take 20 mg by mouth daily.         . FUROSEMIDE 20 MG PO TABS   Oral   Take 1 tablet (20 mg total) by mouth 2 (two) times daily.   20 tablet   2     BP 159/78  Pulse 72  Temp 99 F (37.2 C) (Oral)  Resp 16  SpO2 97%  Physical Exam  Nursing note and vitals reviewed. Constitutional: He is oriented to person, place, and time. He appears well-developed and well-nourished. He appears distressed.  HENT:  Head: Normocephalic and atraumatic.  Mouth/Throat: Oropharynx is clear and moist.  Eyes: Conjunctivae normal and EOM are normal. Pupils are equal, round, and reactive to light.  Neck: Normal range of motion. Neck supple.  Cardiovascular: Normal rate and regular rhythm.   Murmur heard. Pulmonary/Chest: He  is in respiratory distress. He has no wheezes. He has rales.  Abdominal: Soft. Bowel sounds are normal. There is no tenderness.  Musculoskeletal: Normal range of motion. He exhibits edema.  Neurological: He is alert and oriented to person, place, and time. No cranial nerve deficit. He exhibits normal muscle tone. Coordination normal.  Skin: Skin is warm. No rash noted.    ED Course  Procedures (including critical care time)  Labs Reviewed  CBC WITH DIFFERENTIAL - Abnormal; Notable for the following:    WBC 11.6 (*)     RBC 3.55 (*)     Hemoglobin 9.5 (*)     HCT 30.1 (*)     Neutro Abs 8.9 (*)     All other components within normal limits  COMPREHENSIVE METABOLIC PANEL - Abnormal; Notable for the following:    Glucose, Bld 129 (*)     Total Bilirubin 0.2 (*)     All other components within  normal limits  TROPONIN I  URINALYSIS, ROUTINE W REFLEX MICROSCOPIC   Dg Chest Port 1 View  09/13/2012  *RADIOLOGY REPORT*  Clinical Data: Shortness of breath, diabetes, smoker  PORTABLE CHEST - 1 VIEW  Comparison: None.  Findings: Cardiomegaly noted with diffuse increased interstitial opacities in the perihilar regions and lung bases compatible with mild interstitial edema.  Suspect early CHF.  No large effusion or pneumothorax.  IMPRESSION: Cardiomegaly with mild interstitial edema pattern   Original Report Authenticated By: Judie Petit. Miles Costain, M.D.      Date: 09/13/2012  Rate: 95  Rhythm: normal sinus rhythm  QRS Axis: normal  Intervals: normal  ST/T Wave abnormalities: nonspecific T wave changes  Conduction Disutrbances:none  Narrative Interpretation:   Old EKG Reviewed: none available  Results for orders placed during the hospital encounter of 09/13/12  TROPONIN I      Component Value Range   Troponin I <0.30  <0.30 ng/mL  CBC WITH DIFFERENTIAL      Component Value Range   WBC 11.6 (*) 4.0 - 10.5 K/uL   RBC 3.55 (*) 4.22 - 5.81 MIL/uL   Hemoglobin 9.5 (*) 13.0 - 17.0 g/dL   HCT 60.4 (*) 54.0 - 98.1 %   MCV 84.8  78.0 - 100.0 fL   MCH 26.8  26.0 - 34.0 pg   MCHC 31.6  30.0 - 36.0 g/dL   RDW 19.1  47.8 - 29.5 %   Platelets 236  150 - 400 K/uL   Neutrophils Relative 76  43 - 77 %   Neutro Abs 8.9 (*) 1.7 - 7.7 K/uL   Lymphocytes Relative 19  12 - 46 %   Lymphs Abs 2.2  0.7 - 4.0 K/uL   Monocytes Relative 4  3 - 12 %   Monocytes Absolute 0.4  0.1 - 1.0 K/uL   Eosinophils Relative 1  0 - 5 %   Eosinophils Absolute 0.1  0.0 - 0.7 K/uL   Basophils Relative 1  0 - 1 %   Basophils Absolute 0.1  0.0 - 0.1 K/uL  COMPREHENSIVE METABOLIC PANEL      Component Value Range   Sodium 143  135 - 145 mEq/L   Potassium 3.6  3.5 - 5.1 mEq/L   Chloride 108  96 - 112 mEq/L   CO2 23  19 - 32 mEq/L   Glucose, Bld 129 (*) 70 - 99 mg/dL   BUN 13  6 - 23 mg/dL   Creatinine, Ser 6.21  0.50 - 1.35  mg/dL   Calcium  9.5  8.4 - 10.5 mg/dL   Total Protein 7.3  6.0 - 8.3 g/dL   Albumin 3.8  3.5 - 5.2 g/dL   AST 22  0 - 37 U/L   ALT 37  0 - 53 U/L   Alkaline Phosphatase 64  39 - 117 U/L   Total Bilirubin 0.2 (*) 0.3 - 1.2 mg/dL   GFR calc non Af Amer >90  >90 mL/min   GFR calc Af Amer >90  >90 mL/min  URINALYSIS, ROUTINE W REFLEX MICROSCOPIC      Component Value Range   Color, Urine YELLOW  YELLOW   APPearance CLEAR  CLEAR   Specific Gravity, Urine 1.007  1.005 - 1.030   pH 7.0  5.0 - 8.0   Glucose, UA NEGATIVE  NEGATIVE mg/dL   Hgb urine dipstick NEGATIVE  NEGATIVE   Bilirubin Urine NEGATIVE  NEGATIVE   Ketones, ur NEGATIVE  NEGATIVE mg/dL   Protein, ur NEGATIVE  NEGATIVE mg/dL   Urobilinogen, UA 0.2  0.0 - 1.0 mg/dL   Nitrite NEGATIVE  NEGATIVE   Leukocytes, UA NEGATIVE  NEGATIVE     1. CHF (congestive heart failure)     CRITICAL CARE Performed by: Shelda Jakes.   Total critical care time: 30  Critical care time was exclusive of separately billable procedures and treating other patients.  Critical care was necessary to treat or prevent imminent or life-threatening deterioration.  Critical care was time spent personally by me on the following activities: development of treatment plan with patient and/or surrogate as well as nursing, discussions with consultants, evaluation of patient's response to treatment, examination of patient, obtaining history from patient or surrogate, ordering and performing treatments and interventions, ordering and review of laboratory studies, ordering and review of radiographic studies, pulse oximetry and re-evaluation of patient's condition.   MDM   Patient presented by the MS in respiratory distress. Patient with significant shortness of breath has been getting gradually worse over the past 3 days. Patient presented in labor breathing tripoding some difficulty speaking. EMS had given 5 mg of albuterol nebulizer in route without any  neck. Patient has a long-standing leg swelling. Patient's followed by family practice Center.  In ED patient was on nasal cannula oxygen at 5 L. Sats were in the 90% with that. EMS reported that his sats were in the 70% range on room air at home. Patient had no wheezing. Patient given 80 mg of Lasix and diuresed about 3 L of fluid. Patient feels significantly better. Chest x-ray did show some edema chest x-ray was done after the Lasix was given.  Review of patient's cardiology records show echocardiogram consistent with a propensity for congestive heart failure.  Patient with significant improvement now on room air satting 96%. Discussed with family practice on call they agree with starting him on Lasix 20 mg. They have made an appointment to follow him up on Thursday which is 2 days from now.          Shelda Jakes, MD 09/13/12 1126

## 2012-09-13 NOTE — ED Notes (Signed)
Per EMS: pt from home c/o increased SOB x 3 days worse today; pt labored at present; pt given 5mg  albuterol in route with little effect; pt had recent medication change; pt with leg swelling

## 2012-09-15 ENCOUNTER — Encounter: Payer: Self-pay | Admitting: Family Medicine

## 2012-09-15 ENCOUNTER — Telehealth: Payer: Self-pay | Admitting: Family Medicine

## 2012-09-15 ENCOUNTER — Ambulatory Visit (INDEPENDENT_AMBULATORY_CARE_PROVIDER_SITE_OTHER): Payer: Self-pay | Admitting: Family Medicine

## 2012-09-15 VITALS — BP 161/80 | HR 59 | Temp 98.4°F | Ht 72.0 in | Wt 290.0 lb

## 2012-09-15 DIAGNOSIS — I5030 Unspecified diastolic (congestive) heart failure: Secondary | ICD-10-CM

## 2012-09-15 DIAGNOSIS — I509 Heart failure, unspecified: Secondary | ICD-10-CM

## 2012-09-15 LAB — BASIC METABOLIC PANEL
BUN: 10 mg/dL (ref 6–23)
CO2: 27 mEq/L (ref 19–32)
Calcium: 9.3 mg/dL (ref 8.4–10.5)
Chloride: 107 mEq/L (ref 96–112)
Creat: 0.85 mg/dL (ref 0.50–1.35)
Glucose, Bld: 96 mg/dL (ref 70–99)
Potassium: 3.6 mEq/L (ref 3.5–5.3)
Sodium: 141 mEq/L (ref 135–145)

## 2012-09-15 NOTE — Patient Instructions (Addendum)
-  Continue Lasix -Weigh yourself every day and write numbers down and bring to your next visit -Follow-up in 1 week with Dr. Aviva Signs (Dr. Madolyn Frieze OK if [place] not available) -Lab work today. We will go over results at your next visit or Dr. Madolyn Frieze will call you if abnormal.

## 2012-09-15 NOTE — Progress Notes (Signed)
  Subjective:    Patient ID: Seth Sanders, male    DOB: 1951-06-03, 61 y.o.   MRN: [id number]  HPI He presents for SDA for follow-up of ED visit for CHF exacerbation. He was seen on 12/24 and started on Lasix; he presented with respiratory distress, lower extremity edema. His respiratory symptoms improved after a dose of Lasix 80 mg in the ED.  He reports his dry weight is about 275 pounds. He weighed 295 in the ED and 290 today.  He is taking Lasix. He denies lightheadedness. He also denies chest pain and difficulty breathing.   Review of Systems  Allergies, medication, past medical history reviewed.  -Grade 2 diastolic dysfunction diagnosed on ECHO 03/2012 -Bradycardia -Severe obesity -Diabetes, not on insulin, HgbA1c 5.6 08/2012      Objective:   Physical Exam Gen: NAD; well-appearing; obese PSYCH: pleasant, engaged and normally conversant, appropriate to questions, alert and oriented CV: RRR, normal S1/S2, 3/6 systolic ejection murmur RUSB and LLSB PULM: NI WOB; CTAB without w/r/r ABD: soft, NT, ND EXT: 0-1+ pitting pedal edema SKIN: warm, dry, no rash      Assessment & Plan:

## 2012-09-15 NOTE — Telephone Encounter (Signed)
Notified of normal Cr and K. Although K on low side  3.6, same what it was 2 days ago before Lasix. Advised to monitor for muscle cramps.

## 2012-09-15 NOTE — Assessment & Plan Note (Signed)
He was diagnosed with heart failure in the ED 12/24 when he presented with respiratory distress edema that improved following a dose of Lasix. He is now on Lasix 20 mg, which he is tolerating well. His blood pressure is not low; it remains on the higher side; he has refractory hypertension.  PLAN: -Continue Lasix. Check K and Cr today.  -Check daily weights. -Follow-up in 1 week with PCP.

## 2012-09-22 ENCOUNTER — Encounter: Payer: Self-pay | Admitting: Family Medicine

## 2012-09-22 ENCOUNTER — Ambulatory Visit (INDEPENDENT_AMBULATORY_CARE_PROVIDER_SITE_OTHER): Payer: Self-pay | Admitting: Family Medicine

## 2012-09-22 VITALS — BP 172/81 | HR 59 | Temp 97.5°F | Ht 72.0 in | Wt 288.0 lb

## 2012-09-22 DIAGNOSIS — I5030 Unspecified diastolic (congestive) heart failure: Secondary | ICD-10-CM

## 2012-09-22 DIAGNOSIS — I498 Other specified cardiac arrhythmias: Secondary | ICD-10-CM

## 2012-09-22 DIAGNOSIS — I10 Essential (primary) hypertension: Secondary | ICD-10-CM

## 2012-09-22 DIAGNOSIS — I509 Heart failure, unspecified: Secondary | ICD-10-CM

## 2012-09-22 MED ORDER — FUROSEMIDE 20 MG PO TABS: 20.0000 mg | ORAL_TABLET | Freq: Two times a day (BID) | ORAL | Status: DC

## 2012-09-22 NOTE — Progress Notes (Signed)
  Subjective:    Patient ID: Janzen Sacks, male    DOB: 08/20/1951, 62 y.o.   MRN: [id number]  HPI  1.  FU for heart failure diagnosis:  62 yo M seen in ED 2 weeks ago for new onset dyspnea.  Diagnosed with heart failure at that time, this was based on Echo done in Jul 2013 which showed Grade II diastolic dysfunction.  However patient had never been told about this.  Started on Lasix at that time.    FU a few days later here at clinic.  Continued BID lasix.  Had creatinine and potassium checked, which were within normal limits.  Currently he has been doing well.  No further dyspnea, no chest pain.  Notes decrease LE edema.  No cough.  No nocturnal paroxysmal dyspnea.  Does endorse occasional palpitations, noted this increased when taken off diltiazem.  Pt reports feeling "back to normal."   Review of Systems See HPI above for review of systems.       Objective:   Physical Exam Gen:  Alert, cooperative patient who appears stated age in no acute distress.  Vital signs reviewed. Heart:  RRR with Grade III/VI SEM noted  Lungs:  Clear throughout Ext:  Trace BL edema at ankles.        Assessment & Plan:

## 2012-09-22 NOTE — Assessment & Plan Note (Signed)
Not at goal today. Has not yet taken his BP meds. Recommended he go home and do this as soon as he gets home.

## 2012-09-22 NOTE — Patient Instructions (Signed)
You have what we call heart failure, meaning your heart is not acting like a good pump.  Keep taking the fluid pill twice a day.  Your PCP might decrease this in the future.  If you start having any trouble breathing, chest pain, or feel your heart racing come back to see [place].    Heart Failure Heart failure means your heart has trouble pumping blood. The blood is not circulated very well in your body because your heart is weak. Heart failure may cause blood to back up into your lungs. This is commonly called "fluid in the lungs." Heart failure may also cause your ankles and legs to puff up (swell). It is important to take good care of yourself when you have heart failure. HOME CARE Medicine  Take your medicine as told by your doctor.  Do not stop taking your medicine unless told to by your doctor.  Be sure to get your medicine refilled before it runs out.  Do not skip any doses of medicine.  Tell your doctor if you cannot afford your medicine.  Keep a list of all the medicine you take. This should include the name, how much you take, and when you take it.  Ask your doctor if you have any questions about your medicine. Do not take over-the-counter medicine unless your doctor says it is okay. What you eat  Do not drink alcohol unless your doctor says it is okay.  Avoid food that is high in fat. Avoid foods fried in oil or made with fat.  Eat a healthy diet. A dietitian can help you with healthy food choices.  Limit how much salt you eat. Do not eat more than 1500 mg of salt (sodium) a day.  Do not add salt to your food.  Do not eat food made with a lot of salt. Here are some examples:  Canned vegetables.  Canned soups.  Canned drinks.  Hot dogs.  Fast food.  Pizza.  Chips. Check your weight  Weigh yourself every morning. You should do this after you pee (urinate) and before you eat breakfast.  Wear the same amount of clothes each time you weigh yourself.  Write  down your weight every day. Tell your doctor if you gain 3 lb/1.4 kg or more in 1 day or 5 lb/2.3 kg in a week. Blood pressure monitoring  Buy a home blood pressure cuff.  Check your blood pressure as told by your doctor. Write down your blood pressure numbers on a sheet of paper.  Bring your blood pressure numbers to your doctor visits. Smoking  Smoking is bad for your heart.  Ask your doctor how to stop smoking. Exercise  Talk to your doctor about exercise.  Ask how much exercise is right for you.  Exercise as much as you can. Stop if you feel tired, have problems breathing, or have chest pain. Keep all your doctor appointments. GET HELP IF:   You gain 3 lb/1.4 kg or more in 1 day or 5 lb/2.3 kg in a week.  You have trouble breathing.  You have a cough that does not go away.  You have puffy ankles or legs.  You have an enlarged (bloated) belly (abdomen).  You cannot sleep because you have trouble breathing. GET HELP RIGHT AWAY IF:   You pass out (faint).  You have really bad chest pain or pressure. This includes pain or pressure in your:  Arms.  Jaw.  Neck.  Back. MAKE SURE YOU:  Understand these instructions.  Will watch your condition.  Will get help right away if you are not doing well or get worse. Document Released: 06/16/2008 Document Revised: 03/08/2012 Document Reviewed: 01/08/2009 [facility] Patient Information 2013 [place], [place].

## 2012-09-22 NOTE — Assessment & Plan Note (Addendum)
Continue BID Lasix.  May decrease in future if returns to supposed dry weight.  Discussed diagnosis of heart failure with him and what this entails.  He had lots of questions and states he was "very scared" when he read heart failure on his paperwork but the EDP did not explain what this meant.  Recommend starting beta blocker, especially if he continues to have palpitations.  May have been triggering event for his heart failure after stopping diltiazem.  I note he seems to have had trouble with metoprolol in past, perhaps carvedilol would help him.

## 2012-09-22 NOTE — Assessment & Plan Note (Signed)
See CHF below. Event monitor might be helpful in future if still having palpitations to discern diagnosis of heart failure or tachy-brady syndrome.

## 2012-10-10 ENCOUNTER — Encounter: Payer: Self-pay | Admitting: Family Medicine

## 2012-10-10 ENCOUNTER — Ambulatory Visit (INDEPENDENT_AMBULATORY_CARE_PROVIDER_SITE_OTHER): Payer: Self-pay | Admitting: Family Medicine

## 2012-10-10 VITALS — BP 181/63 | HR 54 | Temp 98.1°F | Ht 72.0 in | Wt 285.5 lb

## 2012-10-10 DIAGNOSIS — I10 Essential (primary) hypertension: Secondary | ICD-10-CM

## 2012-10-10 DIAGNOSIS — I5030 Unspecified diastolic (congestive) heart failure: Secondary | ICD-10-CM

## 2012-10-10 DIAGNOSIS — I509 Heart failure, unspecified: Secondary | ICD-10-CM

## 2012-10-10 MED ORDER — AMLODIPINE BESYLATE 5 MG PO TABS: 5.0000 mg | ORAL_TABLET | Freq: Every day | ORAL | Status: DC

## 2012-10-10 MED ORDER — CLONIDINE HCL 0.3 MG PO TABS: 0.1500 mg | ORAL_TABLET | Freq: Two times a day (BID) | ORAL | Status: DC

## 2012-10-10 MED ORDER — FUROSEMIDE 20 MG PO TABS: 20.0000 mg | ORAL_TABLET | Freq: Every day | ORAL | Status: DC

## 2012-10-10 NOTE — Patient Instructions (Addendum)
Your blood pressure is not controlled. This is your new medications and how ta take them. 1. Lisinopril 40 mg 1 tablet daily 2. Catapress 1/2 tab twice a day. At some point we will discontinue this medication since can give you rebound hypertension if you forget to take it. 3. Amlodipine 5 mg 1 tab daily ( this is a new medication) 5. Continue taking Furosemide. Now that your symptoms are more controlled. Take only 1 tab a day. Please make an appointment in 2 weeks. Keep a log of your blood pressure.

## 2012-10-11 NOTE — Progress Notes (Signed)
Family Medicine Office Visit Note   Subjective:   Patient ID: Seth Sanders, male  DOB: [birth year], 62 y.o.. MRN: [id number]   Pt that comes today to f/u HTN. He does not have BP log, but BP today is 180/63 second reading. He states that he took his medications this am and reports compliance on daily bases. His only complaint is mild palpitations that self resolve. He denies chest pain, headaches, dizziness, numbness or weakness.   He had diagnosis of diastolic grade 2 CHF (ECHO done last 03/2012) and was recently evaluated with SOB and LE edema. Furosemide was prescribed 20 mg BID. Pt is now with resolution of this symptoms and denies orthopnea or dyspnea with exertion.   Objective:   Physical Exam: Gen: NAD HEENT: Moist mucous membranes  CV: Regular rate and rhythm, III/VII systolic ejection murmur. PULM: Clear to auscultation bilaterally. No wheezes/rales/rhonchi ABD: Soft, non tender, non distended, normal bowel sounds EXT: No edema Neuro: Alert and oriented x3. No focalization.  Assessment & Plan:

## 2012-10-11 NOTE — Assessment & Plan Note (Signed)
On lisinopril max dose. On clonidine 0.3 BID.  Plan Ideally will get him off clonidine since this medication has side effect of rebound HTN and pt needs to take it several times in a day. Will decrease dose in half today. Pt heart rate today on 54. No BB recommended at this time. Will start amlodipine 5 mg today and f/u in 2 weeks

## 2012-10-11 NOTE — Assessment & Plan Note (Signed)
Weight 285 lb today. No signs of fluid overload.  Plan: Furosemide decreased to 20 mg daily. F/u in 2 weeks as well.

## 2012-10-28 ENCOUNTER — Telehealth: Payer: Self-pay | Admitting: Family Medicine

## 2012-10-28 NOTE — Telephone Encounter (Signed)
Pt is needing new script for his test strips & lancets for Wave Sense Presto meter [facility] [address]

## 2012-10-31 ENCOUNTER — Ambulatory Visit: Payer: Self-pay | Admitting: Family Medicine

## 2012-10-31 ENCOUNTER — Other Ambulatory Visit: Payer: Self-pay | Admitting: Family Medicine

## 2012-10-31 NOTE — Telephone Encounter (Signed)
I would make paper prescription since EPIC doe not have it populated on database.

## 2012-10-31 NOTE — Telephone Encounter (Signed)
Patient is calling back to check on the status of the request for test strips and lancets.

## 2012-11-08 ENCOUNTER — Ambulatory Visit (INDEPENDENT_AMBULATORY_CARE_PROVIDER_SITE_OTHER): Payer: Self-pay | Admitting: Family Medicine

## 2012-11-08 ENCOUNTER — Encounter: Payer: Self-pay | Admitting: Family Medicine

## 2012-11-08 VITALS — BP 174/66 | HR 63 | Temp 99.7°F | Ht 72.0 in | Wt 281.0 lb

## 2012-11-08 DIAGNOSIS — I10 Essential (primary) hypertension: Secondary | ICD-10-CM

## 2012-11-08 DIAGNOSIS — I509 Heart failure, unspecified: Secondary | ICD-10-CM

## 2012-11-08 DIAGNOSIS — I5030 Unspecified diastolic (congestive) heart failure: Secondary | ICD-10-CM

## 2012-11-08 LAB — BASIC METABOLIC PANEL
BUN: 11 mg/dL (ref 6–23)
CO2: 24 mEq/L (ref 19–32)
Calcium: 9.1 mg/dL (ref 8.4–10.5)
Chloride: 110 mEq/L (ref 96–112)
Creat: 0.83 mg/dL (ref 0.50–1.35)
Glucose, Bld: 94 mg/dL (ref 70–99)
Potassium: 3.9 mEq/L (ref 3.5–5.3)
Sodium: 143 mEq/L (ref 135–145)

## 2012-11-08 MED ORDER — AMLODIPINE BESYLATE 10 MG PO TABS: 5.0000 mg | ORAL_TABLET | Freq: Every day | ORAL | Status: DC

## 2012-11-08 NOTE — Progress Notes (Signed)
Family Medicine Office Visit Note   Subjective:   Patient ID: Seth Sanders, male  DOB: [birth year], 62 y.o.. MRN: [id number]   Pt that comes today  For HTN follow up. He bring BP log that showed as follows.  2/16 175/88 2/17 172/94 2/18 173/85  Pt denies side effects of medications, he ran out of clonidine since Thursday last week. He reports is taking Diltiazem, Amlodipine and Lisinopril, even though we stopped Diltiazem last visit. Pt with low literacy that requires multiple time explanation about his medication regimen.   Review of Systems:  Pt denies SOB, chest pain, palpitations, headaches, dizziness, numbness or weakness. No changes on urinary or BM habits. No unintentional weigh loss/gain.  Objective:   Physical Exam: Gen:  NAD HEENT: Moist mucous membranes. Neck no JVD.  CV: Regular rate and rhythm, no murmurs rubs or gallops PULM: Clear to auscultation bilaterally. No wheezes/rales/rhonchi ABD: Soft, non tender, non distended, normal bowel sounds EXT: No edema Neuro: Alert and oriented x3. No focalization  Assessment & Plan:

## 2012-11-08 NOTE — Patient Instructions (Addendum)
The dose of Amlodipine has been increased to 10 mg daily. Continue taking Lisinopril 40 mg. Stop Clonidine and Diltiazem Continue checking your blood pressure. Make an appointment in 2 weeks or sooner if needed.

## 2012-11-09 NOTE — Assessment & Plan Note (Signed)
No signs of fluid overload. Plan Continue Furosemide at current dose BMET to evaluate K

## 2012-11-09 NOTE — Assessment & Plan Note (Addendum)
Pt was continuing taking Diltiazem regardless clear instructions about stopping med and starting Amlodipine. Bp still not controled.  We hope that stopping Cardizem his pulse improves and pt will be able to tolerate BB. Plan for today. Stop Diltiazem and clonidine. Increase dose of amlodipine to 10 mg BMET  F/u in 2 weeks or sooner if needed.

## 2012-11-10 ENCOUNTER — Telehealth: Payer: Self-pay | Admitting: *Deleted

## 2012-11-10 NOTE — Telephone Encounter (Signed)
Patient came into office with blood pressure readings from his employer [facility] [employer] [employer])--BP 178/93 this morning and 179/102 at noon.  Discussed with Dr. Aviva Signs.  Instructed patient to increase furosemide 20 mg to BID and continue amlodipine and lisinopril.  Patient was also instructed to continue writing down his blood pressure readings and also write down the name and time of the medications he takes.  Patient verbalized understanding and repeated back medication change and instructions.  Will follow up with Dr. Aviva Signs on 11/21/12.   Gaylene Brooks, RN

## 2012-11-21 ENCOUNTER — Ambulatory Visit: Payer: Self-pay | Admitting: Family Medicine

## 2012-11-28 NOTE — Telephone Encounter (Signed)
LVM for patient to call back to inform of below 

## 2012-11-28 NOTE — Telephone Encounter (Addendum)
Pt has never rec'd the script for his test meters - he is now out and needs to know what to do.

## 2012-11-28 NOTE — Telephone Encounter (Signed)
I will be in clinic tomorrow and will issue another paper prescription. Please let pt know that he can come and pick it up.  Thanks

## 2012-12-01 ENCOUNTER — Telehealth: Payer: Self-pay | Admitting: Family Medicine

## 2012-12-01 DIAGNOSIS — I10 Essential (primary) hypertension: Secondary | ICD-10-CM

## 2012-12-01 NOTE — Telephone Encounter (Signed)
Pt is asking about his Increase dose of amlodipine to 10 mg - looks like 5mg  was called in Also has a problem with furosemide - supposed to take 2 per day  Needs to talk to nurse

## 2012-12-01 NOTE — Telephone Encounter (Signed)
Spoke with patient and he has been taking meds as directed at last visit. Taking Lasix 20 mg twice daily and amloplidine 10 mg daily.  Patient had to reschedule appointment due to office being closed for snow.Marland Kitchen  He will need refill by tomorrow. Will send message to Dr. Aviva Signs to send in refills.  Patient has appointment for 03/30 with PCP.   BP readings for past three days  03/11   AM 166/86 , Noon 146/76  PM 136/72  03/12  AM 168/86 ,  Noon 139/86  PM 153/81  03/13  AM 168/85    Noon 183/83   Advised patient MD will send in Amlodipine 10 mg tab and take one daily.

## 2012-12-02 MED ORDER — FUROSEMIDE 20 MG PO TABS: 20.0000 mg | ORAL_TABLET | Freq: Two times a day (BID) | ORAL | Status: DC

## 2012-12-02 MED ORDER — AMLODIPINE BESYLATE 10 MG PO TABS: 10.0000 mg | ORAL_TABLET | Freq: Every day | ORAL | Status: DC

## 2012-12-02 NOTE — Telephone Encounter (Signed)
Paged Dr. Lorine Bears 2 today. Unavailable. Will send. to preceptor to advises about refill today.

## 2012-12-02 NOTE — Telephone Encounter (Signed)
Done

## 2012-12-05 NOTE — Telephone Encounter (Signed)
Patient was notified.

## 2012-12-08 ENCOUNTER — Ambulatory Visit (INDEPENDENT_AMBULATORY_CARE_PROVIDER_SITE_OTHER): Payer: Self-pay | Admitting: Family Medicine

## 2012-12-08 ENCOUNTER — Encounter: Payer: Self-pay | Admitting: Family Medicine

## 2012-12-08 VITALS — BP 140/55 | HR 78 | Temp 100.0°F | Ht 72.0 in | Wt 284.0 lb

## 2012-12-08 DIAGNOSIS — I10 Essential (primary) hypertension: Secondary | ICD-10-CM

## 2012-12-08 DIAGNOSIS — I5030 Unspecified diastolic (congestive) heart failure: Secondary | ICD-10-CM

## 2012-12-08 DIAGNOSIS — E119 Type 2 diabetes mellitus without complications: Secondary | ICD-10-CM

## 2012-12-08 DIAGNOSIS — I509 Heart failure, unspecified: Secondary | ICD-10-CM

## 2012-12-08 LAB — BASIC METABOLIC PANEL
BUN: 14 mg/dL (ref 6–23)
CO2: 28 mEq/L (ref 19–32)
Calcium: 9.2 mg/dL (ref 8.4–10.5)
Chloride: 106 mEq/L (ref 96–112)
Creat: 0.81 mg/dL (ref 0.50–1.35)
Glucose, Bld: 85 mg/dL (ref 70–99)
Potassium: 4.2 mEq/L (ref 3.5–5.3)
Sodium: 140 mEq/L (ref 135–145)

## 2012-12-08 LAB — CBC
HCT: 19.2 % — ABNORMAL LOW (ref 39.0–52.0)
Hemoglobin: 5.6 g/dL — CL (ref 13.0–17.0)
MCH: 18.4 pg — ABNORMAL LOW (ref 26.0–34.0)
MCHC: 29.2 g/dL — ABNORMAL LOW (ref 30.0–36.0)
MCV: 66 fL — ABNORMAL LOW (ref 78.0–100.0)
Platelets: 202 10*3/uL (ref 150–400)
RBC: 3.04 MIL/uL — ABNORMAL LOW (ref 4.22–5.81)
RDW: 18.2 % — ABNORMAL HIGH (ref 11.5–15.5)
WBC: 7.9 10*3/uL (ref 4.0–10.5)

## 2012-12-08 LAB — IRON AND TIBC
%SAT: 3 % — ABNORMAL LOW (ref 20–55)
Iron: 16 ug/dL — ABNORMAL LOW (ref 42–165)
TIBC: 494 ug/dL — ABNORMAL HIGH (ref 215–435)
UIBC: 478 ug/dL — ABNORMAL HIGH (ref 125–400)

## 2012-12-08 LAB — POCT GLYCOSYLATED HEMOGLOBIN (HGB A1C): Hemoglobin A1C: 5.7

## 2012-12-08 MED ORDER — FUROSEMIDE 20 MG PO TABS: 20.0000 mg | ORAL_TABLET | Freq: Two times a day (BID) | ORAL | Status: DC

## 2012-12-08 MED ORDER — METFORMIN HCL 1000 MG PO TABS: 1000.0000 mg | ORAL_TABLET | Freq: Two times a day (BID) | ORAL | Status: DC

## 2012-12-08 MED ORDER — AMLODIPINE BESYLATE 10 MG PO TABS: 10.0000 mg | ORAL_TABLET | Freq: Every day | ORAL | Status: DC

## 2012-12-08 MED ORDER — LISINOPRIL 40 MG PO TABS: 40.0000 mg | ORAL_TABLET | Freq: Every day | ORAL | Status: DC

## 2012-12-08 MED ORDER — METOPROLOL SUCCINATE 12.5 MG HALF TABLET: 12.5000 mg | ORAL_TABLET | Freq: Every day | ORAL | Status: DC

## 2012-12-08 NOTE — Patient Instructions (Addendum)
Your blood pressure is better this time this is your current regimen for your hypertension and your heart failure. Lisinopril 40 mg daily Amlodipine 10 mg daily Metoprolol XR 12.5 mg daily Furosemide 20 mg daily I will contact you with the results of labs.

## 2012-12-09 ENCOUNTER — Telehealth: Payer: Self-pay | Admitting: Family Medicine

## 2012-12-09 DIAGNOSIS — D649 Anemia, unspecified: Secondary | ICD-10-CM

## 2012-12-09 NOTE — Assessment & Plan Note (Signed)
Bradycardia in the past but pt pulse in the past 2 visits 68 and 78 respectively. BP still not at goal. Pt at max dose amlodipine and lisinopril.  Plan: Will add small dose metoprolol and monitor response.  Also will obtain Iron studies to evaluate pt's anemia.

## 2012-12-09 NOTE — Assessment & Plan Note (Signed)
Better controlled, but not at goal. Pt with CHF will add metoprolol and monitor response.

## 2012-12-09 NOTE — Progress Notes (Signed)
Family Medicine Office Visit Note   Subjective:   Patient ID: Seth Sanders, male  DOB: Jul 17, 1951, 62 y.o.. MRN: [id number]   Pt that comes today for f/u BP. He reports feeling well and brings BP log which shows average systolic 160 and average diastolic of 85. Pt reports feeling well.  Pt denies SOB, chest pain, palpitations, headaches, dizziness, numbness or weakness. No changes on urinary or BM habits. No melena or hematochezia. Denies side effects of current medications.   Review of Systems:  Per HPI  Objective:   Physical Exam: Gen:  NAD HEENT: Moist mucous membranes. No JVD CV: Regular rate and rhythm, no murmurs rubs or gallops PULM: Clear to auscultation bilaterally. No wheezes/rales/rhonchi ABD: Soft, non tender, non distended, normal bowel sounds EXT: No edema Neuro: Alert and oriented x3. No focalization  Assessment & Plan:

## 2012-12-09 NOTE — Telephone Encounter (Signed)
Called pt regarding abnormal lab results. He reports feeling well. Denies any visible bleeding, no ground coffee stools. Also denies SOB, lightheadedness, chest pain or weakness. We discussed in depth that if any symptoms would present during the weekend pt should come to ED, otherwise I have arranged a lab appointment on Monday to repeat labs.

## 2012-12-12 ENCOUNTER — Telehealth: Payer: Self-pay | Admitting: *Deleted

## 2012-12-12 ENCOUNTER — Observation Stay (HOSPITAL_COMMUNITY)
Admission: AD | Admit: 2012-12-12 | Discharge: 2012-12-13 | Disposition: A | Discharge status: Home / Self Care | Payer: PRIVATE HEALTH INSURANCE | Source: Ambulatory Visit | Attending: Family Medicine | Admitting: Family Medicine

## 2012-12-12 ENCOUNTER — Ambulatory Visit (INDEPENDENT_AMBULATORY_CARE_PROVIDER_SITE_OTHER): Payer: PRIVATE HEALTH INSURANCE | Admitting: Family Medicine

## 2012-12-12 ENCOUNTER — Encounter: Payer: Self-pay | Admitting: Family Medicine

## 2012-12-12 ENCOUNTER — Other Ambulatory Visit (INDEPENDENT_AMBULATORY_CARE_PROVIDER_SITE_OTHER): Payer: PRIVATE HEALTH INSURANCE

## 2012-12-12 ENCOUNTER — Ambulatory Visit: Payer: PRIVATE HEALTH INSURANCE | Admitting: Family Medicine

## 2012-12-12 ENCOUNTER — Encounter (HOSPITAL_COMMUNITY): Payer: Self-pay | Admitting: General Practice

## 2012-12-12 VITALS — BP 187/73 | HR 83 | Temp 98.6°F | Wt 289.5 lb

## 2012-12-12 DIAGNOSIS — E118 Type 2 diabetes mellitus with unspecified complications: Secondary | ICD-10-CM | POA: Diagnosis present

## 2012-12-12 DIAGNOSIS — D500 Iron deficiency anemia secondary to blood loss (chronic): Secondary | ICD-10-CM

## 2012-12-12 DIAGNOSIS — D649 Anemia, unspecified: Secondary | ICD-10-CM

## 2012-12-12 DIAGNOSIS — I5032 Chronic diastolic (congestive) heart failure: Secondary | ICD-10-CM | POA: Insufficient documentation

## 2012-12-12 DIAGNOSIS — I10 Essential (primary) hypertension: Secondary | ICD-10-CM | POA: Insufficient documentation

## 2012-12-12 DIAGNOSIS — E785 Hyperlipidemia, unspecified: Secondary | ICD-10-CM | POA: Insufficient documentation

## 2012-12-12 DIAGNOSIS — E1169 Type 2 diabetes mellitus with other specified complication: Secondary | ICD-10-CM | POA: Diagnosis present

## 2012-12-12 DIAGNOSIS — E119 Type 2 diabetes mellitus without complications: Secondary | ICD-10-CM

## 2012-12-12 DIAGNOSIS — E669 Obesity, unspecified: Secondary | ICD-10-CM | POA: Insufficient documentation

## 2012-12-12 DIAGNOSIS — E1159 Type 2 diabetes mellitus with other circulatory complications: Secondary | ICD-10-CM | POA: Diagnosis present

## 2012-12-12 DIAGNOSIS — I5030 Unspecified diastolic (congestive) heart failure: Secondary | ICD-10-CM | POA: Diagnosis present

## 2012-12-12 DIAGNOSIS — I509 Heart failure, unspecified: Secondary | ICD-10-CM | POA: Insufficient documentation

## 2012-12-12 HISTORY — DX: Heart failure, unspecified: I50.9

## 2012-12-12 HISTORY — DX: Gout, unspecified: M10.9

## 2012-12-12 LAB — CBC
HCT: 19.5 % — ABNORMAL LOW (ref 39.0–52.0)
HCT: 19.3 % — ABNORMAL LOW (ref 39.0–52.0)
Hemoglobin: 5.3 g/dL — CL (ref 13.0–17.0)
Hemoglobin: 5.5 g/dL — CL (ref 13.0–17.0)
MCH: 18.7 pg — ABNORMAL LOW (ref 26.0–34.0)
MCH: 19.6 pg — ABNORMAL LOW (ref 26.0–34.0)
MCHC: 27.2 g/dL — ABNORMAL LOW (ref 30.0–36.0)
MCHC: 28.5 g/dL — ABNORMAL LOW (ref 30.0–36.0)
MCV: 68.9 fL — ABNORMAL LOW (ref 78.0–100.0)
MCV: 68.7 fL — ABNORMAL LOW (ref 78.0–100.0)
Platelets: 239 10*3/uL (ref 150–400)
Platelets: 210 10*3/uL (ref 150–400)
RBC: 2.83 MIL/uL — ABNORMAL LOW (ref 4.22–5.81)
RBC: 2.81 MIL/uL — ABNORMAL LOW (ref 4.22–5.81)
RDW: 18.5 % — ABNORMAL HIGH (ref 11.5–15.5)
RDW: 18.3 % — ABNORMAL HIGH (ref 11.5–15.5)
WBC: 8.7 10*3/uL (ref 4.0–10.5)
WBC: 8.7 10*3/uL (ref 4.0–10.5)

## 2012-12-12 LAB — POCT HEMOGLOBIN: Hemoglobin: 5.4 g/dL — AB (ref 14.1–18.1)

## 2012-12-12 LAB — PREPARE RBC (CROSSMATCH)

## 2012-12-12 LAB — ABO/RH: ABO/RH(D): A POS

## 2012-12-12 LAB — GLUCOSE, CAPILLARY: Glucose-Capillary: 96 mg/dL (ref 70–99)

## 2012-12-12 MED ORDER — AMLODIPINE BESYLATE 10 MG PO TABS
10.0000 mg | ORAL_TABLET | Freq: Every day | ORAL | Status: DC
  Administered 2012-12-13: 10 mg via ORAL
  Filled 2012-12-12 (×2): qty 1

## 2012-12-12 MED ORDER — SODIUM CHLORIDE 0.9 % IJ SOLN
3.0000 mL | Freq: Two times a day (BID) | INTRAMUSCULAR | Status: DC
  Administered 2012-12-13: 3 mL via INTRAVENOUS

## 2012-12-12 MED ORDER — LISINOPRIL 40 MG PO TABS
40.0000 mg | ORAL_TABLET | Freq: Every day | ORAL | Status: DC
  Administered 2012-12-13: 40 mg via ORAL
  Filled 2012-12-12 (×2): qty 1

## 2012-12-12 MED ORDER — SODIUM CHLORIDE 0.9 % IV SOLN
250.0000 mL | INTRAVENOUS | Status: DC | PRN
  Administered 2012-12-12: 18:00:00 via INTRAVENOUS

## 2012-12-12 MED ORDER — METOPROLOL SUCCINATE 12.5 MG HALF TABLET
12.5000 mg | ORAL_TABLET | Freq: Every day | ORAL | Status: DC
  Administered 2012-12-13: 12.5 mg via ORAL
  Filled 2012-12-12 (×2): qty 1

## 2012-12-12 MED ORDER — SODIUM CHLORIDE 0.9 % IJ SOLN: 3.0000 mL | Freq: Two times a day (BID) | INTRAMUSCULAR | Status: DC

## 2012-12-12 MED ORDER — SODIUM CHLORIDE 0.9 % IJ SOLN: 3.0000 mL | INTRAMUSCULAR | Status: DC | PRN

## 2012-12-12 MED ORDER — FUROSEMIDE 40 MG PO TABS
40.0000 mg | ORAL_TABLET | Freq: Two times a day (BID) | ORAL | Status: DC
  Administered 2012-12-12: 40 mg via ORAL
  Filled 2012-12-12 (×4): qty 1

## 2012-12-12 MED ORDER — SIMVASTATIN 10 MG PO TABS
10.0000 mg | ORAL_TABLET | Freq: Every day | ORAL | Status: DC
  Administered 2012-12-12: 10 mg via ORAL
  Filled 2012-12-12 (×2): qty 1

## 2012-12-12 NOTE — Progress Notes (Signed)
Patient ID: Seth Sanders, male   DOB: 10/05/1950, 62 y.o.   MRN: [id number] Subjective:    Seth Sanders is a 62 y.o. male who presents to [facility] today with complaints of anemia:  1.  Anemia:  62 year old male who presented on Friday for followup for hypertension, diabetes, hyperlipidemia. He had routine lab work at that time showed hemoglobin 5.6. As he was more or less asymptomatic his PCP recommended he come back on Monday for recheck of his hemoglobin. Today is 5.4. He does have some lightheadedness. However he denies any chest pain, palpitations, nausea/vomiting. He denies any melena or hematochezia. He denies any recent weight loss. No dysphasia. No abdominal pain. He is not up-to-date on colonoscopy but he has been using fecal occult blood testing for the past several years which has been negative reportedly. However he states that "I cannot remember when the last time I did that."   The following portions of the patient's history were reviewed and updated as appropriate: allergies, current medications, past medical history, family and social history, and problem list. Patient is a nonsmoker.    PMH reviewed.  Past Medical History  Diagnosis Date  . Hypertension   . Hyperlipidemia   . Heart murmur   . Diabetes mellitus without complication    History reviewed. No pertinent past surgical history.  Medications reviewed. No current outpatient prescriptions on file.   No current facility-administered medications for this visit.    ROS as above otherwise neg.  No chest pain, palpitations, SOB, Fever, Chills, Abd pain, N/V/D.   Objective:   Physical Exam BP 187/73  Pulse 83  Temp(Src) 98.6 F (37 C)  Wt 289 lb 8 oz (131.316 kg)  BMI 39.25 kg/m2 Gen:  Alert, cooperative patient who appears stated age in no acute distress.  Vital signs reviewed. HEENT: Pupils equal react to light. Conjunctival pallor noted EOMI,  MMM or mucosal pallor noted Cardiac:  Regular rate and rhythm with Grade  III/VI SEM noted.   Pulm:  Clear to auscultation bilaterally with good air movement.  No wheezes or rales noted.   Abd:  Soft/nondistended/nontender.  Good bowel sounds throughout all four quadrants.  No masses noted.  Exts: Non edematous BL  LE, warm and well perfused.   Results for orders placed in visit on 12/12/12 (from the past 72 hour(s))  CBC     Status: Abnormal   Collection Time    12/12/12  1:49 PM      Result Value Range   WBC 8.7  4.0 - 10.5 K/uL   RBC 2.83 (*) 4.22 - 5.81 MIL/uL   Hemoglobin 5.3 (*) 13.0 - 17.0 g/dL   Comment: REPEATED TO VERIFY   HCT 19.5 (*) 39.0 - 52.0 %   MCV 68.9 (*) 78.0 - 100.0 fL   MCH 18.7 (*) 26.0 - 34.0 pg   MCHC 27.2 (*) 30.0 - 36.0 g/dL   RDW 40.9 (*) 81.1 - 91.4 %   Platelets 239  150 - 400 K/uL  POCT HEMOGLOBIN     Status: Abnormal   Collection Time    12/12/12  1:50 PM      Result Value Range   Hemoglobin 5.4 (*) 14.1 - 18.1 g/dL   Comment: VENOUS SAMPLE

## 2012-12-12 NOTE — H&P (Signed)
Patient ID: Seth Sanders, male DOB: 04/15/1951, 61 y.o. MRN: [id number]  Subjective:   Seth Sanders is a 62 y.o. male who presents to [facility] today with complaints of anemia:  1. Anemia: 62 year old male who presented on Friday for followup for hypertension, diabetes, hyperlipidemia. He had routine lab work at that time showed hemoglobin 5.6. As he was more or less asymptomatic his PCP recommended he come back on Monday for recheck of his hemoglobin. Today is 5.4. He does have some lightheadedness. However he denies any chest pain, palpitations, nausea/vomiting. He denies any hematochezia but states "I do have dark stool after green vegetables.". He denies any recent weight loss. No dysphasia. No abdominal pain. He is not up-to-date on colonoscopy but he has been using fecal occult blood testing for the past several years which has been negative reportedly. However he states that "I cannot remember when the last time I did that."  The following portions of the patient's history were reviewed and updated as appropriate: allergies, current medications, past medical history, family and social history, and problem list. Patient is a nonsmoker.  PMH reviewed.  Past Medical History   Diagnosis  Date   .  Hypertension    .  Hyperlipidemia    .  Heart murmur    .  Diabetes mellitus without complication     History reviewed. No pertinent past surgical history.  Medications reviewed.  No current outpatient prescriptions on file.    No current facility-administered medications for this visit.    ROS as above otherwise neg. No chest pain, palpitations, SOB, Fever, Chills, Abd pain, N/V/D.  Objective:   Physical Exam  BP 187/73  Pulse 83  Temp(Src) 98.6 F (37 C)  Wt 289 lb 8 oz (131.316 kg)  BMI 39.25 kg/m2  Gen: Alert, cooperative patient who appears stated age in no acute distress. Vital signs reviewed.  HEENT: Pupils equal react to light. Conjunctival pallor noted EOMI, MMM or mucosal pallor noted   Cardiac: Regular rate and rhythm with Grade III/VI SEM noted.  Pulm: Clear to auscultation bilaterally with good air movement. No wheezes or rales noted.  Abd: Soft/nondistended/nontender. Good bowel sounds throughout all four quadrants. No masses noted.  Exts: Non edematous BL LE, warm and well perfused.  Results for orders placed in visit on 12/12/12 (from the past 72 hour(s))   CBC Status: Abnormal    Collection Time    12/12/12 1:49 PM   Result  Value  Range    WBC  8.7  4.0 - 10.5 K/uL    RBC  2.83 (*)  4.22 - 5.81 MIL/uL    Hemoglobin  5.3 (*)  13.0 - 17.0 g/dL    Comment:  REPEATED TO VERIFY    HCT  19.5 (*)  39.0 - 52.0 %    MCV  68.9 (*)  78.0 - 100.0 fL    MCH  18.7 (*)  26.0 - 34.0 pg    MCHC  27.2 (*)  30.0 - 36.0 g/dL    RDW  11.9 (*)  14.7 - 15.5 %    Platelets  239  150 - 400 K/uL   POCT HEMOGLOBIN Status: Abnormal    Collection Time    12/12/12 1:50 PM   Result  Value  Range    Hemoglobin  5.4 (*)  14.1 - 18.1 g/dL    Comment:  VENOUS SAMPLE    A/P: 1.  Symptomatic anemia:  - Plan to transfuse 2 units PRBCs - Fecal  occult blood - Hgb was 9.5 in December.  Has dropped several points since then. - Recheck CBC s/p transusion - Iron studies low.  2.  PPX: - No anticoagulation secondary to concern for active bleeding.    3.  Dispo: - Admit to tele bed - Will sign resident note when complete.

## 2012-12-12 NOTE — Telephone Encounter (Signed)
Bonnie [place] advises that Dr. Aviva Signs wants patient to be seen today due to critical hgb value.  Patient advised and appointment is scheduled at 3:30.

## 2012-12-12 NOTE — Progress Notes (Signed)
CBC DONE TODAY Seth Sanders 

## 2012-12-12 NOTE — Telephone Encounter (Signed)
LVM for patient to call back concerning his test strips. The rx was placed up front on 3/13 and patient had never picked up. rx is going to be up there for him to pick up

## 2012-12-12 NOTE — Progress Notes (Signed)
Hemoglobin (venous) done as POCT = 5.4 mg/dL;  Critical result called to Dr. Aviva Signs,  She advised calling pt to return for "Same Day" appt;  Myrlene Broker, RN called pt and scheduled him to return now for appt. Dewitt Hoes, MLS (ASCP)cm

## 2012-12-12 NOTE — H&P (Signed)
Family Medicine Teaching [facility] Admission History and Physical Service Pager: [phone]  Patient name: Seth Sanders Medical record number: [id number] Date of birth: 21-Dec-1950 Age: 62 y.o. Gender: male  Primary Care Provider: Lillia Abed, MD  Chief Complaint: anemia  Assessment and Plan: Seth Sanders is a 62 y.o. year old male presenting with anemia  # Microcytic Anemia-unknown etiology. Previous FOBT reportedly negative.   *will transfuse 2 units and follow up CBC  *FOBT to eval cause. Will need colonoscopy o/p likely.   *PT/INR/PTT ordered. CMP to eval liver function.   *hold home aspirin  *long term smoker (17.5 pack years) -CXR unremarkable but consider lung cancer/CT  *suspect flow murmur-will reevaluate after transfusion  *low iron studies but ferritin not seen. PRBC should replete some, consider repeat at later date and further supplementation.   # History diastolic CHF-will double home lasix with transfusion then resume home dose 20mg  PO BID.  # Hypertension-stable. Continue home meds (amlodipine, lisinopril, metoprolol #HLD-continue statin # DM- a1c 5.7 this month on metformin. Will hold while in house and restart at discharge. No CBG monitoring planned.    1. FEN/GI: carb modified. SLIV 2. Prophylaxis: SCDs 3. Disposition: observation, possible discharge home tomorrow 4. Code Status: Full Code  History of Present Illness: Seth Sanders is a 62 y.o. year old male presenting with anemia.   Patient seen in clinic on Friday for routine follow up of chronic medical issues. Bloodwork at that time showed hgb 5.6. Patient asymptomatic at that time. PCP recommended follow up in clinic today in [place] where he was fund to have a hgb 5.3. Baseline hgb 9.5 3 months ago.  Patients only complain was slight lightheadedness. No chest pain/shortness of breath/palpitations/nausea/vomiting/fever/chills/melena/hematochezia/ hematemesis/hematuria/unintentional weight  loss/dyshpagia/cough. Patient never had colonoscopy but opts for yearly FOBT testing but he is uncertain of last time he completed this. Patient just started following with our clinic on 09/08/12 after being cared for at [facility].   Directly admitted from clinic for observation and transfusion and further workup.   Patient Active Problem List  Diagnosis  . HYPERTENSION  . CARDIAC COMPLICATIONS NEC  . Severe obesity (BMI >= 40)  . DM (diabetes mellitus)  . Hyperlipidemia  . Diastolic CHF   Past Medical History: Past Medical History  Diagnosis Date  . Hypertension   . Hyperlipidemia   . Heart murmur   . Diabetes mellitus without complication    Past Surgical History: No past surgical history on file. Social History: History  Substance Use Topics  . Smoking status: Current Every Day Smoker -- 0.50 packs/day    Types: Cigarettes  . Smokeless tobacco: Not on file     Comment:  Not interested now in quitting/ls  . Alcohol Use: No   For any additional social history documentation, please refer to relevant sections of EMR.  Family History: No family history on file. Allergies: No Known Allergies No current facility-administered medications on file prior to encounter.   Current Outpatient Prescriptions on File Prior to Encounter  Medication Sig Dispense Refill  . amLODipine (NORVASC) 10 MG tablet Take 1 tablet (10 mg total) by mouth daily.  30 tablet  11  . aspirin 81 MG EC tablet Take 81 mg by mouth daily. Swallow whole.      . furosemide (LASIX) 20 MG tablet Take 1 tablet (20 mg total) by mouth 2 (two) times daily.  30 tablet  6  . lisinopril (PRINIVIL,ZESTRIL) 40 MG tablet Take 1 tablet (40 mg total) by mouth  daily.  30 tablet  11  . metFORMIN (GLUCOPHAGE) 1000 MG tablet Take 1 tablet (1,000 mg total) by mouth 2 (two) times daily with a meal.  60 tablet  11  . metoprolol succinate (TOPROL-XL) 12.5 mg TB24 Take 0.5 tablets (12.5 mg total) by mouth daily.  16 tablet  11  .  pravastatin (PRAVACHOL) 20 MG tablet Take 20 mg by mouth daily.       Review Of Systems: Per HPI  Otherwise 12 point review of systems was performed and was unremarkable.  Physical Exam: BP 173/74  Pulse 82  Temp(Src) 99.8 F (37.7 C) (Oral)  Resp 18  Ht 6' (1.829 m)  Wt 288 lb 12.8 oz (130.999 kg)  BMI 39.16 kg/m2  SpO2 96% Exam: Gen: NAD, resting comfortably in bed HEENT: MMM, cataract left eye, pallor in conjunctiva CV: 3/6 SEM, RRR no rubs, gallops Lungs: slight basilar rales Abd: soft/nontender/nondistended/normal bowel sounds  MSK: moves all extremities, trace edema Skin: warm and dry, no rash  Neuro: grossly normal, moves all extremities   Labs and Imaging: CBC BMET   Recent Labs Lab 12/12/12 1349 12/12/12 1350  WBC 8.7  --   HGB 5.3* 5.4*  HCT 19.5*  --   PLT 239  --     Recent Labs Lab 12/08/12 1643  NA 140  K 4.2  CL 106  CO2 28  BUN 14  CREATININE 0.81  GLUCOSE 85  CALCIUM 9.2     ABO/RH     Status: None   Collection Time    12/12/12  5:20 PM      Result Value Range   ABO/RH(D) A POS      Tana Conch, MD 12/12/2012, 5:28 PM

## 2012-12-13 LAB — PROTIME-INR
INR: 1.07 (ref 0.00–1.49)
Prothrombin Time: 13.8 seconds (ref 11.6–15.2)

## 2012-12-13 LAB — TYPE AND SCREEN
ABO/RH(D): A POS
Antibody Screen: NEGATIVE
Unit division: 0
Unit division: 0

## 2012-12-13 LAB — COMPREHENSIVE METABOLIC PANEL
ALT: 12 U/L (ref 0–53)
AST: 13 U/L (ref 0–37)
Albumin: 3.7 g/dL (ref 3.5–5.2)
Alkaline Phosphatase: 55 U/L (ref 39–117)
BUN: 10 mg/dL (ref 6–23)
CO2: 23 mEq/L (ref 19–32)
Calcium: 9.6 mg/dL (ref 8.4–10.5)
Chloride: 105 mEq/L (ref 96–112)
Creatinine, Ser: 0.82 mg/dL (ref 0.50–1.35)
GFR calc Af Amer: >90 mL/min (ref >90)
GFR calc non Af Amer: >90 mL/min (ref >90)
Glucose, Bld: 112 mg/dL — ABNORMAL HIGH (ref 70–99)
Potassium: 3.9 mEq/L (ref 3.5–5.1)
Sodium: 141 mEq/L (ref 135–145)
Total Bilirubin: 0.6 mg/dL (ref 0.3–1.2)
Total Protein: 7 g/dL (ref 6.0–8.3)

## 2012-12-13 LAB — CBC
HCT: 24.9 % — ABNORMAL LOW (ref 39.0–52.0)
Hemoglobin: 7.5 g/dL — ABNORMAL LOW (ref 13.0–17.0)
MCH: 21 pg — ABNORMAL LOW (ref 26.0–34.0)
MCHC: 30.1 g/dL (ref 30.0–36.0)
MCV: 69.7 fL — ABNORMAL LOW (ref 78.0–100.0)
Platelets: 213 10*3/uL (ref 150–400)
RBC: 3.57 MIL/uL — ABNORMAL LOW (ref 4.22–5.81)
RDW: 19.1 % — ABNORMAL HIGH (ref 11.5–15.5)
WBC: 6.9 10*3/uL (ref 4.0–10.5)

## 2012-12-13 LAB — GLUCOSE, CAPILLARY: Glucose-Capillary: 111 mg/dL — ABNORMAL HIGH (ref 70–99)

## 2012-12-13 LAB — APTT: aPTT: 33 seconds (ref 24–37)

## 2012-12-13 MED ORDER — FERROUS SULFATE 325 (65 FE) MG PO TABS
325.0000 mg | ORAL_TABLET | Freq: Three times a day (TID) | ORAL | Status: DC
  Administered 2012-12-13: 325 mg via ORAL
  Filled 2012-12-13 (×3): qty 1

## 2012-12-13 MED ORDER — FERROUS SULFATE 325 (65 FE) MG PO TABS: 325.0000 mg | ORAL_TABLET | Freq: Three times a day (TID) | ORAL | Status: DC

## 2012-12-13 MED ORDER — PANTOPRAZOLE SODIUM 40 MG PO TBEC
40.0000 mg | DELAYED_RELEASE_TABLET | Freq: Every day | ORAL | Status: DC
  Administered 2012-12-13: 40 mg via ORAL
  Filled 2012-12-13: qty 1

## 2012-12-13 MED ORDER — OMEPRAZOLE 40 MG PO CPDR: 40.0000 mg | DELAYED_RELEASE_CAPSULE | Freq: Every day | ORAL | Status: DC

## 2012-12-13 NOTE — Discharge Summary (Signed)
FMTS Attending Admission Note: Seth Crum,MD I  have seen and examined this patient, reviewed their chart. I have discussed this patient with the resident. I agree with the resident's findings, assessment and care plan.  

## 2012-12-13 NOTE — Assessment & Plan Note (Signed)
Most likely etiology.   Plan to admit for transfusion seconday to symptomatic anemia.   I have contacted the inpatient team and sending him to be admitted now.

## 2012-12-13 NOTE — Plan of Care (Signed)
Problem: Phase II Progression Outcomes Goal: Discharge plan established Outcome: Completed/Met Date Met:  12/13/12 To return home

## 2012-12-13 NOTE — Discharge Summary (Signed)
Family Medicine Teaching [facility] Discharge Summary  Patient name: Seth Sanders Medical record number: [id number] Date of birth: May 14, 1951 Age: 62 y.o. Gender: male Date of Admission: 12/12/2012  Date of Discharge: 12/13/12 Admitting Physician: Tobey Grim, MD  Primary Care Provider: Lillia Abed, MD  Indication for Hospitalization: Anemia  Discharge Diagnoses:  Principal Problem:   Anemia due to chronic blood loss Active Problems:   HYPERTENSION   DM (diabetes mellitus)   Hyperlipidemia   Diastolic CHF  Brief Hospital Course:  62 year old male with HTN, HLD, DM, and Diastolic CHF who presents with worsening anemia.  # Anemia - likely to chronic blood loss - Patient presented to [facility] medicine clinic and was subsequently found to have a Hb of 5.3. Given comorbidities patient was admitted and transfused 2 Units of PRBC - Hb improved to 7.5 following transfusion - Review of the patient's recent labs revealed low iron and elevated TIBC.  Patient started on Iron supplementation during admission.  Additionally, patient reported frequent aspirin use and was started on PPI for probable chronic/slow GI bleed. - Patient was well appearing at asymptomatic at discharge. - He will need continued close follow up in the clinic as well as colonoscopy as an outpatient.  # Diastolic CHF - Given Lasix 40 mg x 1 given some crackles on initial presentation - Continued home Lasix on discharge  # HTN - Continued home Norvasc, Lisinopril, Metoprolol.  # HLD  - Continue Pravastatin  # DM - A1C 5.7  - No intervention or CBG checks during admission.  Significant Labs and Imaging:   CBC BMET   Recent Labs Lab 12/12/12 1349 12/12/12 1350 12/12/12 1716 12/13/12 0600  WBC 8.7  --  8.7 6.9  HGB 5.3* 5.4* 5.5* 7.5*  HCT 19.5*  --  19.3* 24.9*  PLT 239  --  210 213    Recent Labs Lab 12/08/12 1643 12/13/12 0600  NA 140 141  K 4.2 3.9  CL 106 105  CO2 28 23  BUN 14 10   CREATININE 0.81 0.82  GLUCOSE 85 112*  CALCIUM 9.2 9.6     Procedures: None  Consultations: None  Discharge Medications:    Medication List    STOP taking these medications       aspirin 81 MG EC tablet     BACK & BODY EXTRA STRENGTH PO      TAKE these medications       amLODipine 10 MG tablet  Commonly known as:  NORVASC  Take 1 tablet (10 mg total) by mouth daily.     ferrous sulfate 325 (65 FE) MG tablet  Take 1 tablet (325 mg total) by mouth 3 (three) times daily with meals.     furosemide 20 MG tablet  Commonly known as:  LASIX  Take 1 tablet (20 mg total) by mouth 2 (two) times daily.     lisinopril 40 MG tablet  Commonly known as:  PRINIVIL,ZESTRIL  Take 40 mg by mouth every morning.     metFORMIN 1000 MG tablet  Commonly known as:  GLUCOPHAGE  Take 1 tablet (1,000 mg total) by mouth 2 (two) times daily with a meal.     metoprolol succinate 12.5 mg Tb24  Commonly known as:  TOPROL-XL  Take 0.5 tablets (12.5 mg total) by mouth daily.     omeprazole 40 MG capsule  Commonly known as:  PRILOSEC  Take 1 capsule (40 mg total) by mouth daily.     pravastatin  20 MG tablet  Commonly known as:  PRAVACHOL  Take 20 mg by mouth every evening.       Issues for Follow Up:  1) Repeat Hb or CBC at follow up 2) Compliance with Iron therapy and PPI 3) Referral to GI for colonoscopy  Outstanding Results:  None  Discharge Instructions:  Patient was counseled important signs and symptoms that should prompt return to medical care, changes in medications, dietary instructions, activity restrictions, and follow up appointments.       Follow-up Information   Follow up with Everlene Other, DO On 12/22/2012. (1:30 pm)    Contact information:   [address] [place] [address] [phone]       Discharge Condition: Stable. Discharged home.  Adriana Simas Buckley, DO 12/13/2012, 1:25 PM

## 2012-12-13 NOTE — H&P (Signed)
FMTS Attending Admission Note: Renold Don MD Personal pager:  [phone] FPTS Service Pager:  [phone]  I  have seen and examined this patient, reviewed their chart. I have discussed this patient with the resident. I agree with the resident's findings, assessment and care plan.  Please see my separate OV note .

## 2012-12-13 NOTE — Progress Notes (Signed)
Utilization review complete. Akela Pocius RN CCM Case Mgmt phone [phone] 

## 2012-12-13 NOTE — Progress Notes (Signed)
Alvin Critchley discharged Home per MD order.  Discharge instructions reviewed and discussed with the patient, all questions and concerns answered. Copy of instructions and scripts given to patient.  Care notes given on iron tablets,  prilosec & anemia.    Medication List    STOP taking these medications       aspirin 81 MG EC tablet     BACK & BODY EXTRA STRENGTH PO      TAKE these medications       amLODipine 10 MG tablet  Commonly known as:  NORVASC  Take 1 tablet (10 mg total) by mouth daily.     ferrous sulfate 325 (65 FE) MG tablet  Take 1 tablet (325 mg total) by mouth 3 (three) times daily with meals.     furosemide 20 MG tablet  Commonly known as:  LASIX  Take 1 tablet (20 mg total) by mouth 2 (two) times daily.     lisinopril 40 MG tablet  Commonly known as:  PRINIVIL,ZESTRIL  Take 40 mg by mouth every morning.     metFORMIN 1000 MG tablet  Commonly known as:  GLUCOPHAGE  Take 1 tablet (1,000 mg total) by mouth 2 (two) times daily with a meal.     metoprolol succinate 12.5 mg Tb24  Commonly known as:  TOPROL-XL  Take 0.5 tablets (12.5 mg total) by mouth daily.     omeprazole 40 MG capsule  Commonly known as:  PRILOSEC  Take 1 capsule (40 mg total) by mouth daily.     pravastatin 20 MG tablet  Commonly known as:  PRAVACHOL  Take 20 mg by mouth every evening.        Patients skin is clean, dry and intact, no evidence of skin break down. IV site discontinued and catheter remains intact. Site without signs and symptoms of complications. Dressing and pressure applied.  Patient escorted to car by NT in a wheelchair,  no distress noted upon discharge.  Laural Benes, Jaela Yepez C 12/13/2012 2:51 PM

## 2012-12-13 NOTE — Progress Notes (Signed)
FMTS Attending Admission Note: Seth Eniola,MD I  have seen and examined this patient, reviewed their chart. I have discussed this patient with the resident. I agree with the resident's findings, assessment and care plan.  

## 2012-12-13 NOTE — Progress Notes (Signed)
Family Medicine Teaching Service Daily Progress Note Service Page: [phone]  Subjective:  Feeling well. No complaints this am. Seth Sanders to be discharged home. No chest pain, SOB.  Of note, patient endorses frequent Aspirin use.  Objective: Temp:  [97.1 F (36.2 C)-99.8 F (37.7 C)] 98.1 F (36.7 C) (03/25 0456) Pulse Rate:  [51-90] 60 (03/25 0456) Resp:  [18-24] 18 (03/25 0456) BP: (121-187)/(61-82) 143/74 mmHg (03/25 0456) SpO2:  [96 %-98 %] 97 % (03/25 0456) Weight:  [288 lb 12.8 oz (130.999 kg)-289 lb 8 oz (131.316 kg)] 288 lb 12.8 oz (130.999 kg) (03/24 1654)  Exam: General: well appearing, NAD. Cardiovascular: RRR. 3/6 SEM. Respiratory: CTAB. No rales appreciated on exam. Abdomen: soft, nontender, nondistended. Extremities: warm, well perfused.  CBC BMET   Recent Labs Lab 12/12/12 1349 12/12/12 1350 12/12/12 1716 12/13/12 0600  WBC 8.7  --  8.7 6.9  HGB 5.3* 5.4* 5.5* 7.5*  HCT 19.5*  --  19.3* 24.9*  PLT 239  --  210 213    Recent Labs Lab 12/08/12 1643 12/13/12 0600  NA 140 141  K 4.2 3.9  CL 106 105  CO2 28 23  BUN 14 10  CREATININE 0.81 0.82  GLUCOSE 85 112*  CALCIUM 9.2 9.6     Assessment/Plan: 62 year old male with HTN, HLD, DM, and Diastolic CHF who presents with worsening anemia.  # Microcytic anemia - Hb 5.5 on admission - Patient transfused 2 U PRBC's overnight.  Hb 7.5 this am. - Anemia panel on 3/20 revealed low iron and elevated TIBC consistent with Iron deficiency anemia - Will start patient on PO Iron today.  Also starting patient on PPI for possible GI bleed. - Patient will need continue follow up in the outpatient setting - Colonoscopy, possible GI eval, repeat anemia panel  # History of Diastolic CHF  - Will continue home Lasix today  # HTN  - Continuing home Norvasc, Lisinopril, Metoprolol.  # HLD - Continue Pravastatin  # DM - A1C 5.7 - No intervention or CBG checks during admission.  FEN/GI: carb modified.  SLIV Prophylaxis: SCDs Disposition: D/C home today. Code Status: Full Code  Everlene Other, DO 12/13/2012, 8:20 AM

## 2012-12-22 ENCOUNTER — Ambulatory Visit: Payer: PRIVATE HEALTH INSURANCE | Admitting: Family Medicine

## 2012-12-23 ENCOUNTER — Encounter: Payer: Self-pay | Admitting: Family Medicine

## 2012-12-23 ENCOUNTER — Ambulatory Visit (INDEPENDENT_AMBULATORY_CARE_PROVIDER_SITE_OTHER): Payer: PRIVATE HEALTH INSURANCE | Admitting: Family Medicine

## 2012-12-23 VITALS — BP 180/78 | HR 64 | Temp 98.4°F | Ht 72.0 in | Wt 285.1 lb

## 2012-12-23 DIAGNOSIS — I10 Essential (primary) hypertension: Secondary | ICD-10-CM

## 2012-12-23 DIAGNOSIS — D500 Iron deficiency anemia secondary to blood loss (chronic): Secondary | ICD-10-CM

## 2012-12-23 LAB — POCT HEMOGLOBIN: Hemoglobin: 8.9 g/dL — AB (ref 14.1–18.1)

## 2012-12-23 NOTE — Assessment & Plan Note (Signed)
Stable and improving. Hb rising - 8.9 today. Will continue Iron supplementation and have patient follow up with PCP in 8 weeks.  Will also continue PPI. Patient has not had screening colonoscopy, so referral ordered today.   Patient instructed that it was okay to resume daily 81 mg Aspirin but to avoid excessive aspirin and NSAIDS.

## 2012-12-23 NOTE — Assessment & Plan Note (Signed)
Blood pressure uncontrolled. Will not increase/add medication today as patient has not taken his lasix (has not had a chance to have it filled yet). Will defer to PCP regarding HTN management.  Patient will likely need additional agent such as hydralazine.  Patient may also benefit from ambulatory pressure monitoring/pharmacy clinic.

## 2012-12-23 NOTE — Patient Instructions (Addendum)
It was good seeing you today.  Continue taking your Iron and omeprazole (in addition to your other medications).  Please be sure to follow up with Dr. Aviva Signs in 2 months or earlier if needed.  Our clinic will be in contact in regard to setting up your Colonoscopy.

## 2012-12-23 NOTE — Progress Notes (Signed)
Subjective:     Patient ID: Seth Sanders, male   DOB: May 08, 1951, 62 y.o.   MRN: [id number]  HPI 62 year old male presents for hospital follow up.  He was admitted from 3/24 - 3/25 for worsening anemia (due to chronic blood loss anemia). Patient transfused during admission.  Hb 7.5 on discharge.  1) Chronic blood loss anemia - Patient reports that he is feeling well. - No report of blood in stool or urine. - No hematemesis.   - He is no longer taking OTC Aspirin medications for pain (he reported frequent use during hospital admission) - ROS: denies fatigue, SOB, chest pain.    2) HTN  - Uncontrolled. - Reports compliance with Norvasc, Metoprolol, and Lisinopril - No reported side effects from medications. - Has not taken Lasix today. - ROS: No dizziness, increase LE edema, headache, or vision changes.   Review of Systems See HPI    Objective:   Physical Exam General: well appearing gentlemen in NAD. Heart: RRR. 2/6 SEM heard throughout the precordium. Lungs: CTAB. No rales appreciated. Extremities: 1+ LE pretibial edema.    Assessment:       Plan:

## 2013-01-02 ENCOUNTER — Telehealth: Payer: Self-pay | Admitting: Family Medicine

## 2013-01-02 NOTE — Telephone Encounter (Signed)
Pt needed a refill on his Lasix but he is supposed to take 2 per day and he only got #30.  Needs #60 at a time for this Also needs refill on his test strips for his Wave Sense Meter   [facility] [address]

## 2013-01-04 ENCOUNTER — Other Ambulatory Visit: Payer: Self-pay | Admitting: Family Medicine

## 2013-01-04 DIAGNOSIS — I5030 Unspecified diastolic (congestive) heart failure: Secondary | ICD-10-CM

## 2013-01-04 MED ORDER — FUROSEMIDE 20 MG PO TABS: 20.0000 mg | ORAL_TABLET | Freq: Two times a day (BID) | ORAL | Status: DC

## 2013-01-04 NOTE — Telephone Encounter (Signed)
LVM for patient to call back. ?

## 2013-01-04 NOTE — Telephone Encounter (Signed)
Patient needs refill on his test strips. °

## 2013-01-04 NOTE — Telephone Encounter (Signed)
Medication changed to 60 tab a month with 11 refills.

## 2013-01-11 ENCOUNTER — Telehealth: Payer: Self-pay | Admitting: Family Medicine

## 2013-01-11 NOTE — Telephone Encounter (Signed)
Patient is calling asking for a less expensive version of Omeprazole to be sent to [facility].  He only has 2 left so he would appreciate if this could happen soon.  Please call and let him know if this can happen.

## 2013-01-19 NOTE — Telephone Encounter (Signed)
Has this been addressed yet?

## 2013-01-25 ENCOUNTER — Telehealth: Payer: Self-pay | Admitting: *Deleted

## 2013-01-25 NOTE — Telephone Encounter (Signed)
Pt needing appointment for gout flare up - made for tomorrow at 9:15. Discussed gout diet with pt and given educational handout on gout. Encouraged to call with any further problems. Yaman Grauberger, Harold Hedge, RN

## 2013-01-26 ENCOUNTER — Ambulatory Visit (INDEPENDENT_AMBULATORY_CARE_PROVIDER_SITE_OTHER): Payer: PRIVATE HEALTH INSURANCE | Admitting: Family Medicine

## 2013-01-26 VITALS — BP 152/80 | Temp 98.5°F | Ht 72.0 in | Wt 286.0 lb

## 2013-01-26 DIAGNOSIS — D500 Iron deficiency anemia secondary to blood loss (chronic): Secondary | ICD-10-CM

## 2013-01-26 DIAGNOSIS — M109 Gout, unspecified: Secondary | ICD-10-CM

## 2013-01-26 DIAGNOSIS — I10 Essential (primary) hypertension: Secondary | ICD-10-CM

## 2013-01-26 MED ORDER — PREDNISONE 50 MG PO TABS: ORAL_TABLET | ORAL | Status: DC

## 2013-01-26 NOTE — Patient Instructions (Addendum)
Gout Gout is an inflammatory condition (arthritis) caused by a buildup of uric acid crystals in the joints. Uric acid is a chemical that is normally present in the blood. Under some circumstances, uric acid can form into crystals in your joints. This causes joint redness, soreness, and swelling (inflammation). Repeat attacks are common. Over time, uric acid crystals can form into masses (tophi) near a joint, causing disfigurement. Gout is treatable and often preventable. CAUSES  The disease begins with elevated levels of uric acid in the blood. Uric acid is produced by your body when it breaks down a naturally found substance called purines. This also happens when you eat certain foods such as meats and fish. Causes of an elevated uric acid level include:  Being passed down from parent to child (heredity).  Diseases that cause increased uric acid production (obesity, psoriasis, some cancers).  Excessive alcohol use.  Diet, especially diets rich in meat and seafood.  Medicines, including certain cancer-fighting drugs (chemotherapy), diuretics, and aspirin.  Chronic kidney disease. The kidneys are no longer able to remove uric acid well.  Problems with metabolism. Conditions strongly associated with gout include:  Obesity.  High blood pressure.  High cholesterol.  Diabetes. Not everyone with elevated uric acid levels gets gout. It is not understood why some people get gout and others do not. Surgery, joint injury, and eating too much of certain foods are some of the factors that can lead to gout. SYMPTOMS   An attack of gout comes on quickly. It causes intense pain with redness, swelling, and warmth in a joint.  Fever can occur.  Often, only one joint is involved. Certain joints are more commonly involved:  Base of the big toe.  Knee.  Ankle.  Wrist.  Finger. Without treatment, an attack usually goes away in a few days to weeks. Between attacks, you usually will not have  symptoms, which is different from many other forms of arthritis. DIAGNOSIS  Your caregiver will suspect gout based on your symptoms and exam. Removal of fluid from the joint (arthrocentesis) is done to check for uric acid crystals. Your caregiver will give you a medicine that numbs the area (local anesthetic) and use a needle to remove joint fluid for exam. Gout is confirmed when uric acid crystals are seen in joint fluid, using a special microscope. Sometimes, blood, urine, and X-ray tests are also used. TREATMENT  There are 2 phases to gout treatment: treating the sudden onset (acute) attack and preventing attacks (prophylaxis). Treatment of an Acute Attack  Medicines are used. These include anti-inflammatory medicines or steroid medicines.  An injection of steroid medicine into the affected joint is sometimes necessary.  The painful joint is rested. Movement can worsen the arthritis.  You may use warm or cold treatments on painful joints, depending which works best for you.  Discuss the use of coffee, vitamin C, or cherries with your caregiver. These may be helpful treatment options. Treatment to Prevent Attacks After the acute attack subsides, your caregiver may advise prophylactic medicine. These medicines either help your kidneys eliminate uric acid from your body or decrease your uric acid production. You may need to stay on these medicines for a very long time. The early phase of treatment with prophylactic medicine can be associated with an increase in acute gout attacks. For this reason, during the first few months of treatment, your caregiver may also advise you to take medicines usually used for acute gout treatment. Be sure you understand your caregiver's directions.   You should also discuss dietary treatment with your caregiver. Certain foods such as meats and fish can increase uric acid levels. Other foods such as dairy can decrease levels. Your caregiver can give you a list of foods  to avoid. HOME CARE INSTRUCTIONS   Do not take aspirin to relieve pain. This raises uric acid levels.  Only take over-the-counter or prescription medicines for pain, discomfort, or fever as directed by your caregiver.  Rest the joint as much as possible. When in bed, keep sheets and blankets off painful areas.  Keep the affected joint raised (elevated).  Use crutches if the painful joint is in your leg.  Drink enough water and fluids to keep your urine clear or pale yellow. This helps your body get rid of uric acid. Do not drink alcoholic beverages. They slow the passage of uric acid.  Follow your caregiver's dietary instructions. Pay careful attention to the amount of protein you eat. Your daily diet should emphasize fruits, vegetables, whole grains, and fat-free or low-fat milk products.  Maintain a healthy body weight. SEEK MEDICAL CARE IF:   You have an oral temperature above 102 F (38.9 C).  You develop diarrhea, vomiting, or any side effects from medicines.  You do not feel better in 24 hours, or you are getting worse. SEEK IMMEDIATE MEDICAL CARE IF:   Your joint becomes suddenly more tender and you have:  Chills.  An oral temperature above 102 F (38.9 C), not controlled by medicine. MAKE SURE YOU:   Understand these instructions.  Will watch your condition.  Will get help right away if you are not doing well or get worse. Document Released: 09/04/2000 Document Revised: 11/30/2011 Document Reviewed: 12/16/2009 Exit[employer] Patient Information 2013 [employer].    

## 2013-01-26 NOTE — Assessment & Plan Note (Signed)
Asymptomatic.  Continue iron and keep follow-up with primary doctor.  Helped patient follow-up on referral to GI today in office.

## 2013-01-26 NOTE — Assessment & Plan Note (Signed)
Improved today but above goal.  Has follow-up with PCP, will re-eval at that time when not in acute pain.

## 2013-01-26 NOTE — Progress Notes (Signed)
  Subjective:    Patient ID: Seth Sanders, male    DOB: 1950/10/01, 62 y.o.   MRN: [id number]  HPI 62 yo here for evaluation of left knee flare of gout  States has pre-existing gout- Current pain consistent with this.  But no flares in several years since he has stopped drinking alcohol.  Flare about 1 week ago.  Used to take indomethacin for flares, but has not taken anything but one acetaminophen and an OTC aleve due to concern about interactions.  No fever, chills.  HYPERTENSION  BP Readings from Last 3 Encounters:  01/26/13 180/80  12/23/12 180/78  12/13/12 155/88    Hypertension ROS: taking medications as instructed, no medication side effects noted, no chest pain on exertion and no dyspnea on exertion.     I have reviewed patient's  PMH, FH, and Social history and Medications as related to this visit. Working with Primary MD to evaluate cause of iron deficiency anemia.  Patient reports compliance with iron tablets.  No dyspnea, fatigue, lightheadedness.     Review of Systems See HPI    Objective:   Physical Exam GEN: Alert & Oriented, No acute distress CV:  Regular Rate & Rhythm, no murmur Respiratory:  Normal work of breathing, CTAB Abd:  + BS, soft, no tenderness to palpation Ext: no pre-tibial edema Right knee:  Mild swelling , able to fully extend and flex.  NO erythema, warmth.       Assessment & Plan:

## 2013-01-26 NOTE — Assessment & Plan Note (Addendum)
Cautions with NSAIDS given undiagnosed iron def anemia and poor ly controlled hypertension.  Prologned duration of flare x 1 week, colchicine may be less effective.  WIll rx 1 week course of Prednisone

## 2013-02-08 ENCOUNTER — Telehealth: Payer: Self-pay | Admitting: Family Medicine

## 2013-02-08 ENCOUNTER — Encounter: Payer: Self-pay | Admitting: Family Medicine

## 2013-02-08 ENCOUNTER — Ambulatory Visit (INDEPENDENT_AMBULATORY_CARE_PROVIDER_SITE_OTHER): Payer: PRIVATE HEALTH INSURANCE | Admitting: Family Medicine

## 2013-02-08 VITALS — BP 187/91 | HR 71 | Temp 99.1°F | Ht 72.0 in | Wt 280.3 lb

## 2013-02-08 DIAGNOSIS — D509 Iron deficiency anemia, unspecified: Secondary | ICD-10-CM

## 2013-02-08 DIAGNOSIS — I10 Essential (primary) hypertension: Secondary | ICD-10-CM

## 2013-02-08 DIAGNOSIS — D500 Iron deficiency anemia secondary to blood loss (chronic): Secondary | ICD-10-CM

## 2013-02-08 DIAGNOSIS — M25561 Pain in right knee: Secondary | ICD-10-CM | POA: Insufficient documentation

## 2013-02-08 DIAGNOSIS — M25569 Pain in unspecified knee: Secondary | ICD-10-CM

## 2013-02-08 DIAGNOSIS — E119 Type 2 diabetes mellitus without complications: Secondary | ICD-10-CM

## 2013-02-08 LAB — POCT HEMOGLOBIN: Hemoglobin: 11.2 g/dL — AB (ref 14.1–18.1)

## 2013-02-08 MED ORDER — OMEPRAZOLE 40 MG PO CPDR: 40.0000 mg | DELAYED_RELEASE_CAPSULE | Freq: Every day | ORAL | Status: DC

## 2013-02-08 MED ORDER — TRAMADOL HCL 50 MG PO TABS: 50.0000 mg | ORAL_TABLET | Freq: Three times a day (TID) | ORAL | Status: DC | PRN

## 2013-02-08 MED ORDER — CARVEDILOL 6.25 MG PO TABS: 6.2500 mg | ORAL_TABLET | Freq: Two times a day (BID) | ORAL | Status: DC

## 2013-02-08 MED ORDER — FERROUS SULFATE 325 (65 FE) MG PO TABS: 325.0000 mg | ORAL_TABLET | Freq: Three times a day (TID) | ORAL | Status: DC

## 2013-02-08 NOTE — Assessment & Plan Note (Signed)
Patient has had this pain intermittently for years.  This is likely secondary to Osteoarthritis.  Patient has had good response to NSAIDS in the past but given anemia, will not resume NSAIDS at this time.  Will try Tramadol for pain control.  If patient continues to have pain, will need standing xray's and may benefit from PT or topical therapy (aspercreme, topical NSAIDS).

## 2013-02-08 NOTE — Assessment & Plan Note (Signed)
Patient is doing well.  Hb checked today - 11.2 Patient advised to continue Iron.  Will need to continue Iron until Hb back WNL.  Will then need repeat iron studies to ensure adequate stores/response.

## 2013-02-08 NOTE — Telephone Encounter (Signed)
[facility] called. Seth Sanders was a no show for his appt today. They just wanted his PCP to know

## 2013-02-08 NOTE — Progress Notes (Signed)
Subjective:     Patient ID: Seth Sanders, male   DOB: March 18, 1951, 62 y.o.   MRN: [id number]  HPI Seth Sanders presents today for follow up.  1) Anemia - Patient recently admitted in March for Anemia - Iron low (16) and TIBC elevated (494) at that time.  Patient was transfused and started on Iron. - Patient now doing well with no complaints of hematochezia, hematuria, melena, fatigue, weakness or SOB. - However, he is concerned about his hemoglobin level and would like it checked today. - He endorses compliance with Iron therapy.  2) HTN - Patient reports that he is concerned about his HTN as someone at work recently passed out from high BP.  - He checks his BP often (at home and at work).  He brought a record of his readings today (range - 136-198/73-98).  He reports that he rarely has good BP. - He endorses compliance with Norvasc, Lisinopril, Metoprolol and Lasix. - Denies headache, SOB, chest pain, LE edema  3) Right knee pain - Patient seen on 5/8 for R knee pain - Patient was diagnosed with Gout and started on Prednisone - [name] reports that he took it for a few days and then stopped as he did not feel any relief. - He continues to have intermittent R joint pain that is worse with activity.   - No warmth, erythema. Mild swelling noted.  Review of Systems See HPI    Objective:   Physical Exam  Constitutional: He appears well-developed and well-nourished. No distress.  Cardiovascular: Normal rate and regular rhythm.   No murmur heard. Pulmonary/Chest: Breath sounds normal. He has no wheezes. He has no rales.  Musculoskeletal:  Right knee - good ROM with no crepitus.  Mild pain with valgus stress.  Negative anterior and posterior drawer.  No appreciable edema, erythema or warmth.      Assessment:     See Problem list    Plan:

## 2013-02-08 NOTE — Telephone Encounter (Signed)
Will FWD to MD and Terese Door, CMA.  Bruna Dills, Darlyne Russian, CMA

## 2013-02-08 NOTE — Assessment & Plan Note (Signed)
Patient continues to have uncontrolled HTN. Switching Metoprolol to Coreg today for added alpha activity.  Could consider switching Lisinopril to Losartan (as losartan lowers uric acid and patient has history of gout). If HTN continues to be uncontrolled, he will likely need additional work up or ambulatory BP monitoring.

## 2013-02-08 NOTE — Patient Instructions (Addendum)
It was nice seeing you today.  I have changed your BP medications.  STOP taking the Metoprolol.  Start taking Coreg (as prescribed).  In regards to your joint pain, take Tramadol every 8 hours as needed.  Please follow up with Dr. Aviva Signs (or me) in 1 month.

## 2013-02-27 ENCOUNTER — Encounter: Payer: Self-pay | Admitting: Gastroenterology

## 2013-03-12 ENCOUNTER — Other Ambulatory Visit: Payer: Self-pay | Admitting: Family Medicine

## 2013-03-13 ENCOUNTER — Other Ambulatory Visit: Payer: Self-pay | Admitting: *Deleted

## 2013-03-14 IMAGING — CR DG CHEST 1V PORT
1 series · 1 of 1 positions shown · non-contrast
Comparison: None.

CLINICAL DATA: Shortness of breath, diabetes, smoker

PORTABLE CHEST - 1 VIEW

[AP]
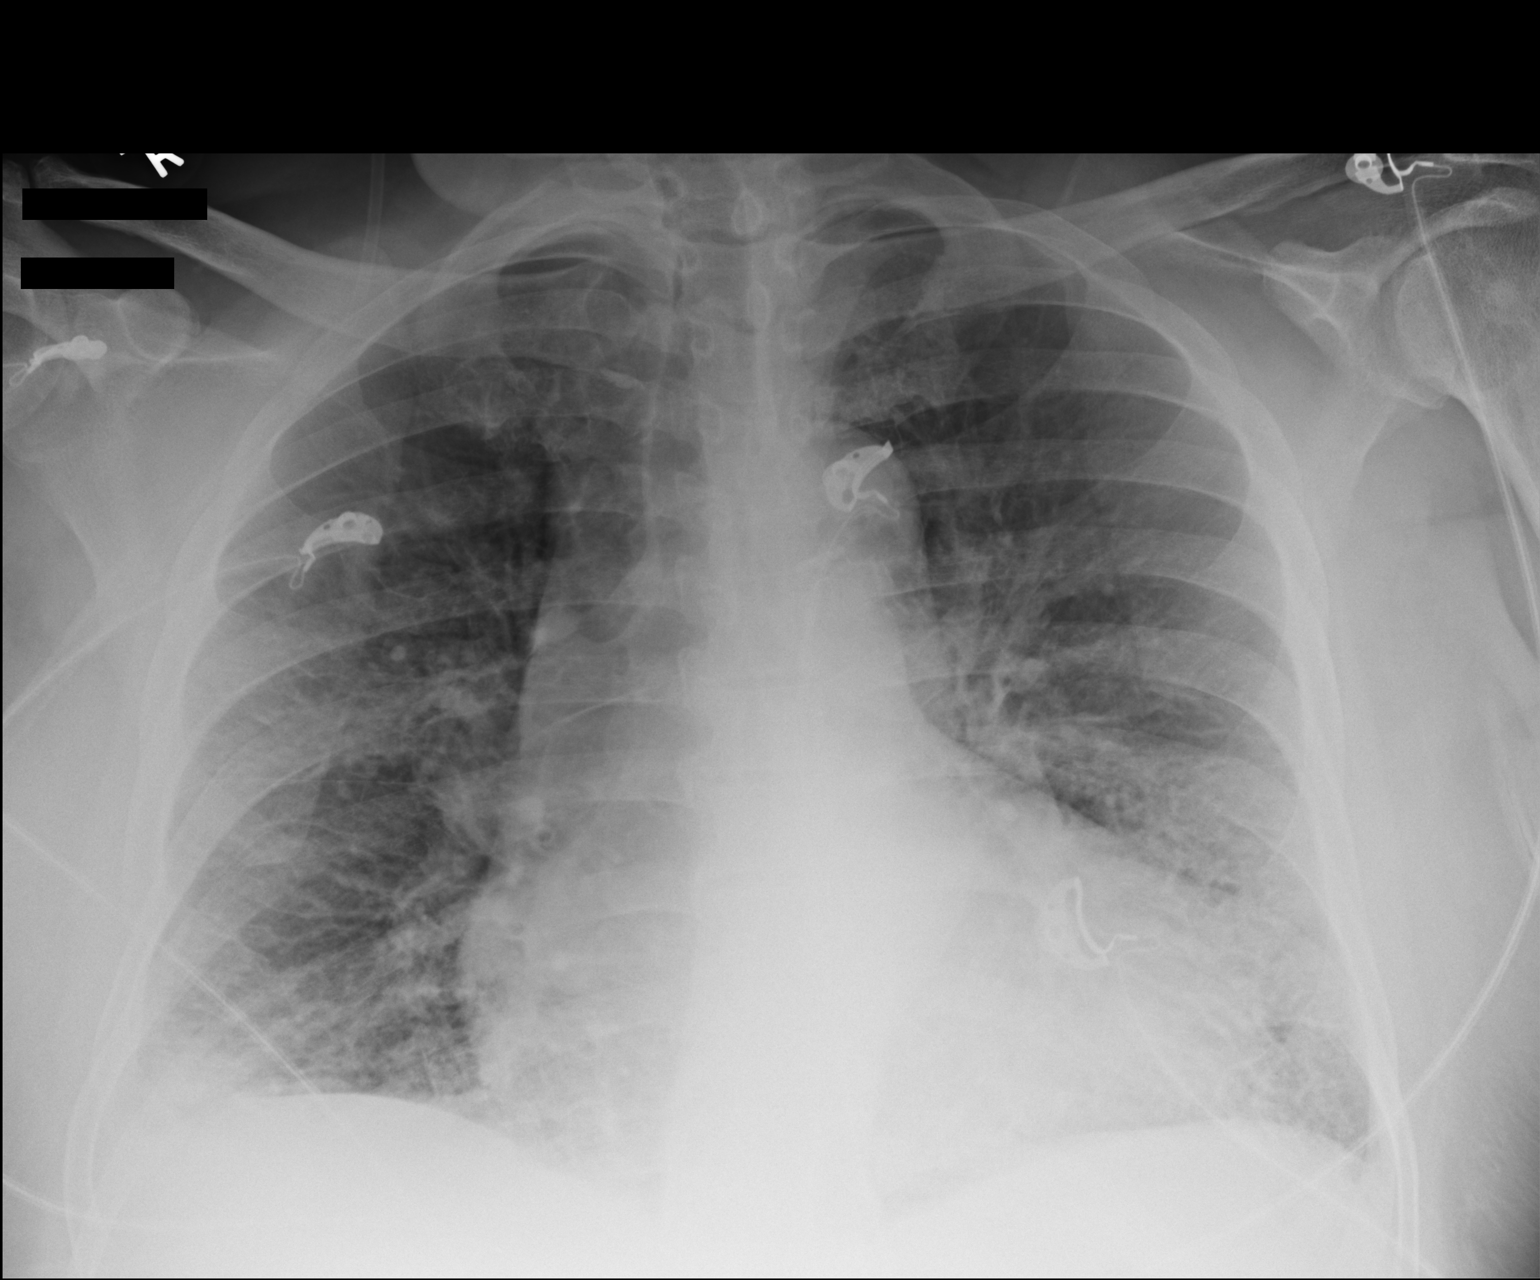

[1 of 1 positions shown; findings below may reference images not displayed]

FINDINGS: Cardiomegaly noted with diffuse increased interstitial
opacities in the perihilar regions and lung bases compatible with
mild interstitial edema.  Suspect early CHF.  No large effusion or
pneumothorax.
IMPRESSION: Cardiomegaly with mild interstitial edema pattern

## 2013-03-15 ENCOUNTER — Ambulatory Visit: Payer: PRIVATE HEALTH INSURANCE | Admitting: Family Medicine

## 2013-03-15 MED ORDER — TRAMADOL HCL 50 MG PO TABS: 50.0000 mg | ORAL_TABLET | Freq: Three times a day (TID) | ORAL | Status: DC | PRN

## 2013-03-28 ENCOUNTER — Encounter: Payer: Self-pay | Admitting: Family Medicine

## 2013-03-28 ENCOUNTER — Ambulatory Visit (INDEPENDENT_AMBULATORY_CARE_PROVIDER_SITE_OTHER): Payer: PRIVATE HEALTH INSURANCE | Admitting: Family Medicine

## 2013-03-28 VITALS — BP 187/83 | HR 48 | Temp 98.8°F | Ht 72.0 in | Wt 288.0 lb

## 2013-03-28 DIAGNOSIS — M109 Gout, unspecified: Secondary | ICD-10-CM

## 2013-03-28 LAB — URIC ACID: Uric Acid, Serum: 5.3 mg/dL (ref 4.0–7.8)

## 2013-03-28 MED ORDER — HYDROCODONE-ACETAMINOPHEN 5-325MG PREPACK (~~LOC~~: 1.0000 | ORAL_TABLET | Freq: Three times a day (TID) | ORAL | Status: DC | PRN

## 2013-03-28 MED ORDER — COLCHICINE 0.6 MG PO TABS: 0.6000 mg | ORAL_TABLET | Freq: Two times a day (BID) | ORAL | Status: DC

## 2013-03-28 MED ORDER — HYDROCODONE-ACETAMINOPHEN 5-325 MG PO TABS: ORAL_TABLET | ORAL | Status: DC

## 2013-03-28 NOTE — Assessment & Plan Note (Signed)
1) Colchicine  - Day 1: 1.2mg  initial dose, followed by 0.6 mg in evening  - Then 0.6 mg BID 2) Obtained Uric acid level 3) Pain  - Vicodin: 5/325 TID as needed  4) Return to clinic Friday to assess treatment effect

## 2013-03-28 NOTE — Patient Instructions (Addendum)
1) Take Colchicine - Take two tabs this afternoon - Take one tab tonight - Then take one tab, two times a day for one week  2) Vicodin - Take for pain: 1 tab up to three times a day  3) Return to clinic Friday

## 2013-03-28 NOTE — Progress Notes (Signed)
[facility]  Patient name: Seth Sanders MRN [id number]  Date of birth: 04/30/1951  CC & HPI:  Seth Sanders is a 62 y.o. male presenting today for Rt hand/wrist pain and swelling that started this past Saturday. 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 He also reports pain, swelling, and warmth in his Rt knee Friday, which resolved Saturday prior to his wrist pain that resolved on its own. He reports previous Dx of gout and arthritis, but is unsure if either is correct.  Quality: dull, achy Severity: 9/10 Location: distal 1/4 of right forearm and wrist Radiation: into first three digits  Onset: Acute, beginning 3 days ago Duration: Constant Alleviating Factors: Tylenol 1gram q6hrs; Tramadol (which was previously prescribed has not helped) Aggravating Factors: Movement of wrist and hand Associated Symptoms: Decreased grip strength; unable turn keys, pick up pots at work,   ROS: Denies fevers, rash    ROS:  Please See HPI above   Pertinent History Reviewed:  Medical & Surgical Hx:  Reviewed: Significant for DM, GI bleed associated with NSAID use Medications & Allergies: Reviewed & Updated - see associated section Social History: Reviewed -  reports that he has been smoking Cigarettes.  He has a 17.5 pack-year smoking history. He has never used smokeless tobacco.  Objective Findings:  Vitals: BP 187/83  Pulse 48  Temp(Src) 98.8 F (37.1 C) (Oral)  Ht 6' (1.829 m)  Wt 288 lb (130.636 kg)  BMI 39.05 kg/m2  Gen: NAD CV: RRR w/o m/r/g Resp: CTAB w/ normal respiratory effort Rt hand: Moderate swelling and slight warmth in wrist compared to Lt, extreme tenderness to palpation & wrist flexion/extension    Assessment & Plan:   Please See Problem Focused Assessment & Plan

## 2013-03-31 ENCOUNTER — Ambulatory Visit: Payer: PRIVATE HEALTH INSURANCE | Admitting: Family Medicine

## 2013-05-18 ENCOUNTER — Ambulatory Visit (INDEPENDENT_AMBULATORY_CARE_PROVIDER_SITE_OTHER): Payer: PRIVATE HEALTH INSURANCE | Admitting: Family Medicine

## 2013-05-18 ENCOUNTER — Encounter: Payer: Self-pay | Admitting: Family Medicine

## 2013-05-18 ENCOUNTER — Telehealth: Payer: Self-pay | Admitting: Family Medicine

## 2013-05-18 VITALS — BP 161/74 | HR 58 | Temp 99.0°F | Wt 287.0 lb

## 2013-05-18 DIAGNOSIS — I10 Essential (primary) hypertension: Secondary | ICD-10-CM

## 2013-05-18 MED ORDER — INDOMETHACIN 25 MG PO CAPS: 25.0000 mg | ORAL_CAPSULE | Freq: Two times a day (BID) | ORAL | Status: DC

## 2013-05-18 MED ORDER — CARVEDILOL 6.25 MG PO TABS: 6.2500 mg | ORAL_TABLET | Freq: Two times a day (BID) | ORAL | Status: DC

## 2013-05-18 NOTE — Telephone Encounter (Signed)
Pt needs tramadol 50mg  sent to the pharmacy.

## 2013-05-18 NOTE — Patient Instructions (Addendum)
You blood pressure is not controlled. Please let only one provider/center treat your hypertension.  There has been change in your medications. All the changes are shown in this after visit summary. Follow up in 2 weeks or sooner if needed.

## 2013-05-19 ENCOUNTER — Telehealth: Payer: Self-pay | Admitting: *Deleted

## 2013-05-19 MED ORDER — TRAMADOL HCL 50 MG PO TABS: 50.0000 mg | ORAL_TABLET | Freq: Three times a day (TID) | ORAL | Status: DC | PRN

## 2013-05-19 NOTE — Telephone Encounter (Signed)
LVM for patient to call back to inform him that RX for Tramadol has been called in

## 2013-05-19 NOTE — Telephone Encounter (Signed)
Patient called back and was given the message below

## 2013-05-20 NOTE — Progress Notes (Signed)
Family Medicine Office Visit Note   Subjective:   Patient ID: Seth Sanders, male  DOB: Aug 15, 1951, 62 y.o.. MRN: [id number]   Pt that comes today for f/u his HTN. He comes reporting that could not make an appointment soon enough with [place] and went to a different provider after his last appointment here. His BP meds were changed to Lisinopril 30 Mg BID and metoprolol was added. Hi brings his BP log and BP continues to be uncontrolled and his pulse in the 45-55 range. He reports has been completely asymptomatic and compliant with medication. Denies noticeable side effects of medications.  Review of Systems:  Pt denies SOB, chest pain, palpitations, headaches, dizziness, numbness or weakness. No changes on urinary or BM habits. No unintentional weigh loss/gain.  Objective:   Physical Exam: Gen:  NAD HEENT: Moist mucous membranes  CV: Regular rate and rhythm, no murmurs rubs or gallops PULM: Clear to auscultation bilaterally. No wheezes/rales/rhonchi ABD: Soft, non tender, non distended, normal bowel sounds EXT: No edema Neuro: Alert and oriented x3. No focalization  Assessment & Plan:

## 2013-05-20 NOTE — Assessment & Plan Note (Signed)
Pt was switched back to metoprolol and lisinopril was above max dose with BID frequency by an outside of network doctor. P/ Metoprolol discontinued. Coreg re-prescribed. Lisinopril back to 40 mg daily and possible switching to Losartan due to extra benefit in gout. F/u in 2 weeks or sooner if needed.

## 2013-06-01 ENCOUNTER — Telehealth: Payer: Self-pay | Admitting: Family Medicine

## 2013-06-01 NOTE — Telephone Encounter (Signed)
Pt wants vicodin refilled. He has been without it about 2 weeks. His gout has acted up about 4 days ago. [facility] has [facility] and [place] [facility] and not getting any results. [facility]. [facility] [address] Please advise [phone]

## 2013-06-02 ENCOUNTER — Telehealth: Payer: Self-pay | Admitting: Family Medicine

## 2013-06-02 NOTE — Telephone Encounter (Signed)
Pt was wanting to know what the status of his refill request JW

## 2013-06-02 NOTE — Telephone Encounter (Signed)
Called and told patient Dr Aviva Signs would not refill vicodin, he will need to discuss that at his upcoming appointment.Busick, Rodena Medin

## 2013-06-06 ENCOUNTER — Ambulatory Visit: Payer: PRIVATE HEALTH INSURANCE | Admitting: Family Medicine

## 2013-09-06 ENCOUNTER — Telehealth: Payer: Self-pay | Admitting: Family Medicine

## 2013-09-06 NOTE — Telephone Encounter (Signed)
Pt is calling and needs a referral to the dental clinic. He would like the paperwork left up front to pick up. Call when ready. jw

## 2013-09-07 NOTE — Telephone Encounter (Signed)
Patient calls again about dental referral.

## 2013-09-08 NOTE — Telephone Encounter (Signed)
Asked patient about dental insurance,he stated he was to pay out of pocket.I informed patient that he would not need a ref if he was to paying out of pocket. Stancil Deisher, Virgel Bouquet

## 2013-09-20 ENCOUNTER — Telehealth: Payer: Self-pay | Admitting: Family Medicine

## 2013-09-20 NOTE — Telephone Encounter (Signed)
Pt called and needs a refill on carvedilol. jw

## 2013-09-25 ENCOUNTER — Other Ambulatory Visit: Payer: Self-pay | Admitting: Family Medicine

## 2013-09-25 MED ORDER — CARVEDILOL 6.25 MG PO TABS: 6.2500 mg | ORAL_TABLET | Freq: Two times a day (BID) | ORAL | Status: DC

## 2013-09-25 NOTE — Telephone Encounter (Signed)
Carvedilol refilled.

## 2013-11-23 ENCOUNTER — Telehealth: Payer: Self-pay | Admitting: Family Medicine

## 2013-11-23 NOTE — Telephone Encounter (Signed)
Has had gout for about a week. Wants to know if he can take his fluid pill 3 times a day until the swelling goes down Can leave message on voicemail

## 2013-11-24 NOTE — Telephone Encounter (Signed)
LVM for patient to call back. ?

## 2013-11-24 NOTE — Telephone Encounter (Signed)
Fluid pill will not take the swelling from gout away. Do not change frequency of furosemide. Take only colchicine as indicated with gout flares. If more concerns he needs to get seen.

## 2014-01-10 ENCOUNTER — Other Ambulatory Visit: Payer: Self-pay | Admitting: *Deleted

## 2014-01-11 MED ORDER — LISINOPRIL 40 MG PO TABS: 40.0000 mg | ORAL_TABLET | Freq: Every morning | ORAL | Status: DC

## 2014-01-12 ENCOUNTER — Other Ambulatory Visit: Payer: Self-pay | Admitting: *Deleted

## 2014-01-12 ENCOUNTER — Other Ambulatory Visit: Payer: Self-pay | Admitting: Family Medicine

## 2014-01-12 ENCOUNTER — Telehealth: Payer: Self-pay | Admitting: Family Medicine

## 2014-01-12 DIAGNOSIS — I5030 Unspecified diastolic (congestive) heart failure: Secondary | ICD-10-CM

## 2014-01-12 DIAGNOSIS — E119 Type 2 diabetes mellitus without complications: Secondary | ICD-10-CM

## 2014-01-12 MED ORDER — FUROSEMIDE 20 MG PO TABS: 20.0000 mg | ORAL_TABLET | Freq: Two times a day (BID) | ORAL | Status: DC

## 2014-01-12 MED ORDER — LISINOPRIL 40 MG PO TABS: 40.0000 mg | ORAL_TABLET | Freq: Every morning | ORAL | Status: DC

## 2014-01-12 MED ORDER — CARVEDILOL 6.25 MG PO TABS: 6.2500 mg | ORAL_TABLET | Freq: Two times a day (BID) | ORAL | Status: DC

## 2014-01-12 MED ORDER — INDOMETHACIN 25 MG PO CAPS: 25.0000 mg | ORAL_CAPSULE | Freq: Two times a day (BID) | ORAL | Status: DC

## 2014-01-12 MED ORDER — METFORMIN HCL 1000 MG PO TABS: 1000.0000 mg | ORAL_TABLET | Freq: Two times a day (BID) | ORAL | Status: DC

## 2014-01-12 MED ORDER — AMLODIPINE BESYLATE 10 MG PO TABS: 10.0000 mg | ORAL_TABLET | Freq: Every day | ORAL | Status: DC

## 2014-01-12 MED ORDER — PRAVASTATIN SODIUM 20 MG PO TABS: 20.0000 mg | ORAL_TABLET | Freq: Every evening | ORAL | Status: DC

## 2014-01-12 NOTE — Telephone Encounter (Signed)
Need refills for Metformin,Amlodipine,Lisinopril,Pravastatin, Furosemide, Indomethacin and Carvedilol sent to [facility] [address]

## 2014-01-12 NOTE — Telephone Encounter (Signed)
All meds listed have been refilled.

## 2014-01-15 NOTE — Telephone Encounter (Signed)
LVM for patient to call back. ?

## 2014-01-15 NOTE — Telephone Encounter (Signed)
Patient informed that all meds have been refilled.

## 2014-02-16 ENCOUNTER — Other Ambulatory Visit: Payer: Self-pay | Admitting: *Deleted

## 2014-02-16 DIAGNOSIS — M109 Gout, unspecified: Secondary | ICD-10-CM

## 2014-02-19 MED ORDER — COLCHICINE 0.6 MG PO TABS: 0.6000 mg | ORAL_TABLET | Freq: Two times a day (BID) | ORAL | Status: DC

## 2014-03-13 ENCOUNTER — Telehealth: Payer: Self-pay | Admitting: Family Medicine

## 2014-03-14 NOTE — Telephone Encounter (Signed)
Called Pt about DM care. When Pt calls back please schedule an appointment to check BP, LDL, and A1C.

## 2014-03-22 ENCOUNTER — Encounter: Payer: Self-pay | Admitting: Family Medicine

## 2014-03-22 ENCOUNTER — Ambulatory Visit (INDEPENDENT_AMBULATORY_CARE_PROVIDER_SITE_OTHER): Payer: BC Managed Care – PPO | Admitting: Family Medicine

## 2014-03-22 VITALS — BP 190/69 | HR 60 | Temp 98.4°F | Ht 72.0 in | Wt 289.2 lb

## 2014-03-22 DIAGNOSIS — M545 Low back pain, unspecified: Secondary | ICD-10-CM | POA: Insufficient documentation

## 2014-03-22 DIAGNOSIS — D649 Anemia, unspecified: Secondary | ICD-10-CM

## 2014-03-22 DIAGNOSIS — E785 Hyperlipidemia, unspecified: Secondary | ICD-10-CM

## 2014-03-22 DIAGNOSIS — I10 Essential (primary) hypertension: Secondary | ICD-10-CM | POA: Diagnosis not present

## 2014-03-22 DIAGNOSIS — E119 Type 2 diabetes mellitus without complications: Secondary | ICD-10-CM | POA: Diagnosis not present

## 2014-03-22 DIAGNOSIS — R011 Cardiac murmur, unspecified: Secondary | ICD-10-CM

## 2014-03-22 HISTORY — DX: Cardiac murmur, unspecified: R01.1

## 2014-03-22 LAB — POCT GLYCOSYLATED HEMOGLOBIN (HGB A1C): Hemoglobin A1C: 6.1

## 2014-03-22 MED ORDER — TRAMADOL HCL 50 MG PO TABS: 50.0000 mg | ORAL_TABLET | Freq: Three times a day (TID) | ORAL | Status: DC | PRN

## 2014-03-22 MED ORDER — SPIRONOLACTONE 25 MG PO TABS: 25.0000 mg | ORAL_TABLET | Freq: Every day | ORAL | Status: DC

## 2014-03-22 NOTE — Assessment & Plan Note (Addendum)
Well-controlled on metformin. A1c at goal.  Diabetic foot exam performed today. Patient is to followup for retinal scan later this year.

## 2014-03-22 NOTE — Progress Notes (Signed)
   Subjective:    Patient ID: Seth Sanders, male    DOB: 1951-03-15, 63 y.o.   MRN: [id number]  HPI 63 year old male with PMH of DM-2, HLD, and HTN presents to the clinic for follow up.  1) HTN Disease Monitoring: - Home BP Monitoring - Recently stopped checking at home.  Medications:Lisinoprol 40 mg, Amlodipine 10 mg, Coreg 6.25 mg BID. Compliance -  Yes.  ROS: Denies chest pain, SOB, lightheadedness/dizziness   2) Diabetes mellitus, type 2 Disease Monitoring  Blood Sugar Ranges: Fasting 120's.  Polyuria: yes   Visual problems: yes  Medications: Metformin 1000 mg BID Medication Compliance: yes  Medication Side Effects  Hypoglycemia: no   Preventitive Health Care  - In need of foot and eye exam.   3) Low back pain - Has been persistent x 1 year - No associated radiation; No LE numbness/tingling - Pain is exacerbated physical activity - He has been taking Tylenol extra strength without improvement.  Social Hx - Current everyday smoker.  Review of Systems Per HPI    Objective:   Physical Exam Filed Vitals:   03/22/14 1357  BP: 190/69  Pulse: 60  Temp: 98.4 F (36.9 C)   Exam: General: well appearing obese male in no acute distress. Cardiovascular: RRR. 2/6 systolic murmur. Respiratory: CTAB. No rales, rhonchi, or wheeze. Abdomen: obese, soft, nontender, nondistended. Back: Lumbar spine - area of tenderness over the spinous process. No appreciable redness/erythema.  Decreased ROM secondary to pain.  Diabetic Foot Check -  Appearance - no lesions, ulcers or calluses Skin - no unusual pallor or redness Monofilament testing -  Right - Great toe, medial, central, lateral ball and posterior foot intact Left - Great toe, medial, central, lateral ball and posterior foot intact     Assessment & Plan:  See Problem List

## 2014-03-22 NOTE — Patient Instructions (Signed)
It was nice to see you today.  Your diabetes is well controlled.  I am starting you on a medication called spironolactone.  You will need labs next week.  Regarding your pain, take the prescribed medication as needed.  You can continue to take tylenol daily.  Follow up in ~ 1 month for BP check.

## 2014-03-22 NOTE — Assessment & Plan Note (Signed)
Appears musculoskeletal in origin. No evidence of nerve impingement/radicular symptoms. Offered patient physical therapy and patient declined. Will treat with PRN tramadol and Tylenol.

## 2014-03-22 NOTE — Assessment & Plan Note (Signed)
BP uncontrolled. Patient endorses compliance with medications. Patient to continue Norvasc, Coreg, and lisinopril.  Patient is at max dose of Norvasc and lisinopril.  Cannot increase Coreg as patient's heart rate is 60 and has history of bradycardia. Adding Spironolactone today.  Patient to return in one week for labs to ensure no hyperkalemia with ACE inhibitor and spironolactone.   If blood pressure continues to be uncontrolled, will send for ambulatory blood pressure monitoring.

## 2014-03-26 ENCOUNTER — Telehealth: Payer: Self-pay | Admitting: Family Medicine

## 2014-03-26 ENCOUNTER — Other Ambulatory Visit: Payer: PRIVATE HEALTH INSURANCE

## 2014-03-26 NOTE — Telephone Encounter (Signed)
Pt was given two paper prescriptions for Tramadol and Aldactone. He lost both of them and [facility] Please call him and let him know what he needs to do. jw

## 2014-03-26 NOTE — Telephone Encounter (Signed)
Pt called and now has found his Tramadol prescription and Aldactone was called in. jw

## 2014-03-30 ENCOUNTER — Other Ambulatory Visit: Payer: BC Managed Care – PPO

## 2014-03-30 DIAGNOSIS — E119 Type 2 diabetes mellitus without complications: Secondary | ICD-10-CM

## 2014-03-30 NOTE — Progress Notes (Signed)
CMP,CBC AND FLP DONE TODAY Onika Gudiel 

## 2014-03-31 LAB — COMPREHENSIVE METABOLIC PANEL
ALT: 11 U/L (ref 0–53)
AST: 10 U/L (ref 0–37)
Albumin: 4.1 g/dL (ref 3.5–5.2)
Alkaline Phosphatase: 55 U/L (ref 39–117)
BUN: 8 mg/dL (ref 6–23)
CO2: 27 mEq/L (ref 19–32)
Calcium: 9.2 mg/dL (ref 8.4–10.5)
Chloride: 104 mEq/L (ref 96–112)
Creat: 0.63 mg/dL (ref 0.50–1.35)
Glucose, Bld: 138 mg/dL — ABNORMAL HIGH (ref 70–99)
Potassium: 3.6 mEq/L (ref 3.5–5.3)
Sodium: 141 mEq/L (ref 135–145)
Total Bilirubin: 0.4 mg/dL (ref 0.2–1.2)
Total Protein: 6.6 g/dL (ref 6.0–8.3)

## 2014-03-31 LAB — CBC
HCT: 43.6 % (ref 39.0–52.0)
Hemoglobin: 14.2 g/dL (ref 13.0–17.0)
MCH: 28.3 pg (ref 26.0–34.0)
MCHC: 32.6 g/dL (ref 30.0–36.0)
MCV: 87 fL (ref 78.0–100.0)
Platelets: 198 10*3/uL (ref 150–400)
RBC: 5.01 MIL/uL (ref 4.22–5.81)
RDW: 15.3 % (ref 11.5–15.5)
WBC: 9.1 10*3/uL (ref 4.0–10.5)

## 2014-03-31 LAB — LIPID PANEL
Cholesterol: 133 mg/dL (ref 0–200)
HDL: 54 mg/dL (ref >39)
LDL Cholesterol: 68 mg/dL (ref 0–99)
Total CHOL/HDL Ratio: 2.5 Ratio
Triglycerides: 54 mg/dL (ref <150)
VLDL: 11 mg/dL (ref 0–40)

## 2014-04-06 ENCOUNTER — Ambulatory Visit: Payer: PRIVATE HEALTH INSURANCE | Admitting: Family Medicine

## 2014-04-19 ENCOUNTER — Encounter (HOSPITAL_COMMUNITY): Payer: Self-pay | Admitting: Emergency Medicine

## 2014-04-19 ENCOUNTER — Emergency Department (INDEPENDENT_AMBULATORY_CARE_PROVIDER_SITE_OTHER)
Admission: EM | Admit: 2014-04-19 | Discharge: 2014-04-19 | Disposition: A | Discharge status: Home / Self Care | Payer: BC Managed Care – PPO | Source: Home / Self Care | Attending: Family Medicine | Admitting: Family Medicine

## 2014-04-19 DIAGNOSIS — K029 Dental caries, unspecified: Secondary | ICD-10-CM

## 2014-04-19 MED ORDER — CLINDAMYCIN HCL 300 MG PO CAPS: 300.0000 mg | ORAL_CAPSULE | Freq: Three times a day (TID) | ORAL | Status: DC

## 2014-04-19 NOTE — Discharge Instructions (Signed)
Take medicine as prescribed, see your dentist as soon as possible °

## 2014-04-19 NOTE — ED Provider Notes (Signed)
CSN: [id number]     Arrival date & time 04/19/14  1737 History   First MD Initiated Contact with Patient 04/19/14 1806     No chief complaint on file.  (Consider location/radiation/quality/duration/timing/severity/associated sxs/prior Treatment) Patient is a 63 y.o. male presenting with tooth pain. The history is provided by the patient.  Dental Pain Location:  Upper and lower Upper teeth location:  15/LU 2nd molar, 3/RU 1st molar and 4/RU 2nd bicuspid Lower teeth location:  30/RL 1st molar Quality:  Throbbing Severity:  Moderate Onset quality:  Gradual Duration:  1 week Chronicity:  Chronic Context: poor dentition     Past Medical History  Diagnosis Date  . Hypertension   . Hyperlipidemia   . Heart murmur   . Diabetes mellitus without complication   . CHF (congestive heart failure)   . Gout    Past Surgical History  Procedure Laterality Date  . No past surgeries     No family history on file. History  Substance Use Topics  . Smoking status: Current Every Day Smoker -- 0.50 packs/day for 35 years    Types: Cigarettes  . Smokeless tobacco: Never Used     Comment:  Not interested now in quitting/ls  . Alcohol Use: No    Review of Systems  Constitutional: Negative.   HENT: Positive for dental problem.     Allergies  Review of patient's allergies indicates no known allergies.  Home Medications   Prior to Admission medications   Medication Sig Start Date End Date Taking? Authorizing Provider  amLODipine (NORVASC) 10 MG tablet Take 1 tablet (10 mg total) by mouth daily. 01/12/14   Dayarmys Piloto de Criselda Peaches, MD  carvedilol (COREG) 6.25 MG tablet Take 1 tablet (6.25 mg total) by mouth 2 (two) times daily with a meal. 01/12/14   Dayarmys [place] de Criselda Peaches, MD  clindamycin (CLEOCIN) 300 MG capsule Take 1 capsule (300 mg total) by mouth 3 (three) times daily. 04/19/14   Linna Hoff, MD  colchicine 0.6 MG tablet Take 1 tablet (0.6 mg total) by mouth 2 (two) times daily.     Dayarmys [place] de Criselda Peaches, MD  ferrous sulfate 325 (65 FE) MG tablet Take 1 tablet (325 mg total) by mouth 3 (three) times daily with meals. 02/08/13   Tommie Sams, DO  furosemide (LASIX) 20 MG tablet Take 1 tablet (20 mg total) by mouth 2 (two) times daily. 01/12/14   Dayarmys Pil[place] de Criselda Peaches, MD  indomethacin (INDOCIN) 25 MG capsule Take 1 capsule (25 mg total) by mouth 2 (two) times daily with a meal. 01/12/14   Dayarmys [place] de Criselda Peaches, MD  lisinopril (PRINIVIL,ZESTRIL) 40 MG tablet Take 1 tablet (40 mg total) by mouth every morning. 01/12/14   Dayarmys [place] de Criselda Peaches, MD  metFORMIN (GLUCOPHAGE) 1000 MG tablet Take 1 tablet (1,000 mg total) by mouth 2 (two) times daily with a meal. 01/12/14   Dayarmys [place] de Criselda Peaches, MD  omeprazole (PRILOSEC) 40 MG capsule Take 1 capsule (40 mg total) by mouth daily. 02/08/13   Tommie Sams, DO  pravastatin (PRAVACHOL) 20 MG tablet Take 1 tablet (20 mg total) by mouth every evening. 01/12/14   Dayarmys [place] de Criselda Peaches, MD  spironolactone (ALDACTONE) 25 MG tablet Take 1 tablet (25 mg total) by mouth daily. 03/22/14   Tommie Sams, DO  traMADol (ULTRAM) 50 MG tablet Take 1 tablet (50 mg total) by mouth every 8 (eight) hours as needed.  03/22/14   Tommie SamsJayce G Cook, DO   There were no vitals taken for this visit. Physical Exam  Nursing note and vitals reviewed. Constitutional: He appears well-developed and well-nourished. No distress.  HENT:  Mouth/Throat: Oropharynx is clear and moist.  Mult missing teeth, remaining teeth tender with gingivitis.,cavities.  Lymphadenopathy:    He has cervical adenopathy.  Skin: Skin is warm and dry.    ED Course  Procedures (including critical care time) Labs Review Labs Reviewed - No data to display  Imaging Review No results found.   MDM   1. Pain due to dental caries        Linna HoffJames D Guillermo Nehring, MD 04/19/14 [phone]

## 2014-04-19 NOTE — ED Notes (Addendum)
Pt c/o dental pain x1 week See physicians note Has appt w/dentist next Thursday.  Alert w/no signs of acute distress.

## 2014-06-08 ENCOUNTER — Ambulatory Visit: Payer: BC Managed Care – PPO | Admitting: Family Medicine

## 2014-07-02 ENCOUNTER — Ambulatory Visit: Payer: BC Managed Care – PPO | Admitting: Family Medicine

## 2014-08-01 ENCOUNTER — Encounter: Payer: Self-pay | Admitting: Family Medicine

## 2014-08-01 ENCOUNTER — Ambulatory Visit (INDEPENDENT_AMBULATORY_CARE_PROVIDER_SITE_OTHER): Payer: BC Managed Care – PPO | Admitting: Family Medicine

## 2014-08-01 VITALS — BP 140/80 | HR 62 | Temp 97.9°F | Ht 72.0 in | Wt 287.9 lb

## 2014-08-01 DIAGNOSIS — M545 Low back pain, unspecified: Secondary | ICD-10-CM

## 2014-08-01 DIAGNOSIS — Z8719 Personal history of other diseases of the digestive system: Secondary | ICD-10-CM | POA: Diagnosis not present

## 2014-08-01 MED ORDER — CYCLOBENZAPRINE HCL 10 MG PO TABS: 10.0000 mg | ORAL_TABLET | Freq: Three times a day (TID) | ORAL | Status: DC | PRN

## 2014-08-01 NOTE — Progress Notes (Signed)
   Subjective:    Patient ID: Seth Sanders, male    DOB: Sep 10, 1951, 63 y.o.   MRN: [id number]  CC: Back pain  HPI 63 year old male with DM 2, hyperlipidemia, hypertension, diastolic heart failure who presents with complaints of back pain.  1) Back pain:  Patient reports that he has had low back pain for approximately 5 months.  Pain is moderate in severity.  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  Pain described as achy.  Patient has had no improvement with Tylenol or tramadol was prescribed previously.  The only thing is work from that in the past was NSAIDs.  Pain is worse with physical activity (bending/lifting).   Patient denies any lower extremity numbness or tingling. Pain does not radiate. No reported several anesthesia, urinary or fecal continence, or weakness.  Review of Systems Per HPI    Objective:   Physical Exam Filed Vitals:   08/01/14 1018  BP: 140/80  Pulse: 62  Temp: 97.9 F (36.6 C)   Exam: General: well appearing obese male in NAD. Back Exam:  Inspection: Unremarkable.  Motion: Full ROM but pain elicited with extension and sidebending.  SLR laying: Negative  Palpable tenderness: None. Sensation: Grossly intact.  Reflexes: 2+ at both patellar tendons, 2+ at achilles tendons.   Strength at foot  Plantar-flexion: 5/5 Dorsi-flexion: 5/5 Eversion Leg strength:  Quad: 5/5 Hamstring: 5/5  Gait unremarkable.     Assessment & Plan:  See Problem List

## 2014-08-01 NOTE — Patient Instructions (Signed)
It was nice to see you today.  Get your xray today.  Start the flexeril tonight (you can use up to 3 times daily).  Continue Tylenol (1000 mg three times daily).  Consider physical therapy.  Follow up a the beginning of the year for an annual physical.

## 2014-08-01 NOTE — Assessment & Plan Note (Addendum)
MSK versus OA/DDD. Patient has not had work up previously.  Will obtain Xray of the lumbar spine today. Treatment options are limited secondary to prior GI bleed with NSAIDs, lack of response to tramadol, and refusal to go to physical therapy. I recommended scheduled Tylenol 1000 mg 3 times daily.  I also recommended physical therapy the patient declined.  Will also treat potential spasm component with Flexeril.

## 2014-08-01 NOTE — Patient Instructions (Signed)
It was nice to see you today.  Get your xray today.  Start the flexeril tonight (you can use up to 3 times daily).  Continue Tylenol (1000 mg three times daily).  Consider physical therapy.  Follow up a the beginning of the year for an annual physical.  

## 2014-08-08 ENCOUNTER — Ambulatory Visit: Payer: BC Managed Care – PPO | Admitting: Family Medicine

## 2014-08-27 ENCOUNTER — Other Ambulatory Visit: Payer: Self-pay | Admitting: *Deleted

## 2014-09-05 ENCOUNTER — Other Ambulatory Visit: Payer: Self-pay | Admitting: Family Medicine

## 2014-09-05 NOTE — Telephone Encounter (Signed)
Pt called and has been out of his Lisinopril for 2 weeks. He would like a refill as soon as we can. He thought that the pharmacy had called us. jw

## 2014-09-06 ENCOUNTER — Other Ambulatory Visit: Payer: Self-pay | Admitting: *Deleted

## 2014-09-06 DIAGNOSIS — I10 Essential (primary) hypertension: Secondary | ICD-10-CM

## 2014-09-06 MED ORDER — AMLODIPINE BESYLATE 10 MG PO TABS: 10.0000 mg | ORAL_TABLET | Freq: Every day | ORAL | Status: DC

## 2014-09-06 MED ORDER — LISINOPRIL 40 MG PO TABS: 40.0000 mg | ORAL_TABLET | Freq: Every morning | ORAL | Status: DC

## 2014-09-06 NOTE — Telephone Encounter (Signed)
LVM for patient to call back. ?

## 2014-09-06 NOTE — Telephone Encounter (Signed)
Needs refills on amlodipine [facility]

## 2014-09-06 NOTE — Telephone Encounter (Signed)
Patient has been informed.

## 2014-09-10 ENCOUNTER — Encounter: Payer: Self-pay | Admitting: *Deleted

## 2014-09-10 NOTE — Telephone Encounter (Signed)
This encounter was created in error - please disregard.

## 2014-09-17 ENCOUNTER — Telehealth: Payer: Self-pay | Admitting: Family Medicine

## 2014-09-17 NOTE — Telephone Encounter (Signed)
No record of this in epic.  How much is he taking?

## 2014-09-17 NOTE — Telephone Encounter (Signed)
Pt asking for a refill on his gabapentin, says Dr. Aviva SignsPiloto prescribed this, [facility] to [facility]

## 2014-09-18 ENCOUNTER — Telehealth: Payer: Self-pay | Admitting: Family Medicine

## 2014-09-18 MED ORDER — GABAPENTIN 300 MG PO CAPS: 300.0000 mg | ORAL_CAPSULE | Freq: Three times a day (TID) | ORAL | Status: DC | PRN

## 2014-09-18 NOTE — Telephone Encounter (Signed)
Is he referring to indomethacin? I have declined this due to his history of GI bleed.

## 2014-09-18 NOTE — Telephone Encounter (Signed)
Rx sent 

## 2014-09-18 NOTE — Telephone Encounter (Signed)
Spoke with patient and he stated that he was taking 300mg  PRN, prescribed by Dr. Aviva SignsPiloto

## 2014-09-18 NOTE — Telephone Encounter (Signed)
Pt called and needs a refill on his Gout medication [facility] to the [facility]. jw

## 2014-10-16 ENCOUNTER — Other Ambulatory Visit: Payer: Self-pay | Admitting: *Deleted

## 2014-10-16 MED ORDER — PRAVASTATIN SODIUM 20 MG PO TABS: 20.0000 mg | ORAL_TABLET | Freq: Every evening | ORAL | Status: DC

## 2014-10-16 MED ORDER — SPIRONOLACTONE 25 MG PO TABS: 25.0000 mg | ORAL_TABLET | Freq: Every day | ORAL | Status: DC

## 2014-10-16 NOTE — Telephone Encounter (Signed)
Left voice message for pt to call and schedule a follow up appt for gout.  The other two medications were refilled and sent to [facility].  Clovis PuMartin, Tamika L, RN

## 2014-10-16 NOTE — Telephone Encounter (Addendum)
Pt in nurse clinic for refill on a couple of medications because he is out.  Pt also stated he is having a gout flare up and does not have any medication to take for it.  Pt was previously on Indomethacin 25 mg for a long time.  Dr. Adriana Simasook did not refill medication due to history of GI bleed.  Pt is requesting something else to take.  Pt also stated he has been taking his brother's medication but does not know the name of it.  Pt stated he only took the medication a few times because it made his heart rate increase.  Please advise.  Clovis PuMartin, Tamika L, RN

## 2014-11-22 ENCOUNTER — Ambulatory Visit: Payer: Self-pay | Admitting: Family Medicine

## 2015-02-08 ENCOUNTER — Telehealth: Payer: Self-pay | Admitting: *Deleted

## 2015-02-08 DIAGNOSIS — I10 Essential (primary) hypertension: Secondary | ICD-10-CM

## 2015-02-08 NOTE — Telephone Encounter (Signed)
[name] called to say that all his prescriptions need refilling because of the expiration dates on the bottles.  [facility] [address].

## 2015-02-09 MED ORDER — OMEPRAZOLE 40 MG PO CPDR: 40.0000 mg | DELAYED_RELEASE_CAPSULE | Freq: Every day | ORAL | Status: DC

## 2015-02-09 MED ORDER — METFORMIN HCL 1000 MG PO TABS: 1000.0000 mg | ORAL_TABLET | Freq: Two times a day (BID) | ORAL | Status: DC

## 2015-02-09 MED ORDER — FERROUS SULFATE 325 (65 FE) MG PO TABS: 325.0000 mg | ORAL_TABLET | Freq: Three times a day (TID) | ORAL | Status: DC

## 2015-02-09 MED ORDER — PRAVASTATIN SODIUM 20 MG PO TABS: 20.0000 mg | ORAL_TABLET | Freq: Every evening | ORAL | Status: DC

## 2015-02-09 MED ORDER — AMLODIPINE BESYLATE 10 MG PO TABS
10.0000 mg | ORAL_TABLET | Freq: Every day | ORAL | Status: DC
Start: 2015-02-09 — End: 2015-10-02

## 2015-02-09 MED ORDER — FUROSEMIDE 20 MG PO TABS: 20.0000 mg | ORAL_TABLET | Freq: Two times a day (BID) | ORAL | Status: DC

## 2015-02-09 MED ORDER — SPIRONOLACTONE 25 MG PO TABS: 25.0000 mg | ORAL_TABLET | Freq: Every day | ORAL | Status: DC

## 2015-02-09 MED ORDER — CARVEDILOL 6.25 MG PO TABS: 6.2500 mg | ORAL_TABLET | Freq: Two times a day (BID) | ORAL | Status: DC

## 2015-02-09 MED ORDER — GABAPENTIN 300 MG PO CAPS: 300.0000 mg | ORAL_CAPSULE | Freq: Three times a day (TID) | ORAL | Status: DC | PRN

## 2015-02-09 MED ORDER — LISINOPRIL 40 MG PO TABS: 40.0000 mg | ORAL_TABLET | Freq: Every morning | ORAL | Status: DC

## 2015-03-27 ENCOUNTER — Other Ambulatory Visit: Payer: Self-pay | Admitting: Family Medicine

## 2015-05-15 ENCOUNTER — Other Ambulatory Visit: Payer: Self-pay | Admitting: *Deleted

## 2015-05-15 NOTE — Telephone Encounter (Signed)
Patient called and states that he contacted pharmacy last week for a refill on his aldactone but hasn't heard anything yet.  Per Epic patient should have enough refills to last until November but the pharmacy told him that he was out.  Will forward to MD to see if this can be refilled.  Patient is out of medication. Jazmin Hartsell,CMA

## 2015-05-16 MED ORDER — SPIRONOLACTONE 25 MG PO TABS: 25.0000 mg | ORAL_TABLET | Freq: Every day | ORAL | Status: DC

## 2015-06-03 ENCOUNTER — Other Ambulatory Visit: Payer: Self-pay | Admitting: Family Medicine

## 2015-06-03 NOTE — Telephone Encounter (Signed)
Dr. Cook is in a new office, please advise refill?  

## 2015-06-09 ENCOUNTER — Other Ambulatory Visit: Payer: Self-pay | Admitting: Family Medicine

## 2015-06-10 NOTE — Telephone Encounter (Signed)
Please advise refill as Dr. Adriana Simas in a different office. Thanks

## 2015-06-10 NOTE — Telephone Encounter (Signed)
Please document what needs to be refilled.

## 2015-07-02 ENCOUNTER — Ambulatory Visit: Payer: Self-pay | Admitting: Family Medicine

## 2015-07-05 ENCOUNTER — Telehealth: Payer: Self-pay | Admitting: Family Medicine

## 2015-07-05 NOTE — Telephone Encounter (Signed)
Pt called and needs enough of his Flexeril to get him to his appointment 10/26. Pt has been NO-Showing since last year. jw

## 2015-07-05 NOTE — Telephone Encounter (Signed)
Called [phone], "your call can not be taken at this moment and you can not leave a message on this phone" was the message. If pt calls back please give him the information below. Sunday SpillersSharon T Kinsie Belford, CMA

## 2015-07-05 NOTE — Telephone Encounter (Signed)
Flexeril was given for acute muscle spasm at last office visit 07/2014 with no plan to be a chronic medication. Will not refill at this time. Schedule same day appointment if back pain is too bad to wait until 10/26.

## 2015-07-15 NOTE — Telephone Encounter (Signed)
LM for patient to call back.  Please let him know that he will have to keep his upcoming appt to discuss the need for this medication. Rajohn Henery,CMA

## 2015-07-15 NOTE — Telephone Encounter (Signed)
Pt returned call and I informed him of message below. Pt expressed understanding and was able to repeat date and time of appointment back to me. Sadie Reynolds, ASA

## 2015-07-17 ENCOUNTER — Ambulatory Visit (INDEPENDENT_AMBULATORY_CARE_PROVIDER_SITE_OTHER): Payer: 59 | Admitting: Family Medicine

## 2015-07-17 VITALS — BP 137/80 | HR 80 | Temp 98.5°F | Ht 72.0 in | Wt 313.8 lb

## 2015-07-17 DIAGNOSIS — I10 Essential (primary) hypertension: Secondary | ICD-10-CM | POA: Diagnosis not present

## 2015-07-17 DIAGNOSIS — E119 Type 2 diabetes mellitus without complications: Secondary | ICD-10-CM | POA: Diagnosis not present

## 2015-07-17 DIAGNOSIS — D500 Iron deficiency anemia secondary to blood loss (chronic): Secondary | ICD-10-CM | POA: Diagnosis not present

## 2015-07-17 DIAGNOSIS — Z8719 Personal history of other diseases of the digestive system: Secondary | ICD-10-CM | POA: Diagnosis not present

## 2015-07-17 LAB — CBC
HCT: 42.6 % (ref 39.0–52.0)
Hemoglobin: 14.4 g/dL (ref 13.0–17.0)
MCH: 30.5 pg (ref 26.0–34.0)
MCHC: 33.8 g/dL (ref 30.0–36.0)
MCV: 90.3 fL (ref 78.0–100.0)
MPV: 11.2 fL (ref 8.6–12.4)
Platelets: 186 10*3/uL (ref 150–400)
RBC: 4.72 MIL/uL (ref 4.22–5.81)
RDW: 14.1 % (ref 11.5–15.5)
WBC: 9.9 10*3/uL (ref 4.0–10.5)

## 2015-07-17 LAB — BASIC METABOLIC PANEL WITH GFR
BUN: 10 mg/dL (ref 7–25)
CO2: 26 mmol/L (ref 20–31)
Calcium: 9.8 mg/dL (ref 8.6–10.3)
Chloride: 106 mmol/L (ref 98–110)
Creat: 0.68 mg/dL — ABNORMAL LOW (ref 0.70–1.25)
GFR, Est African American: >89 mL/min (ref >=60)
GFR, Est Non African American: >89 mL/min (ref >=60)
Glucose, Bld: 133 mg/dL — ABNORMAL HIGH (ref 65–99)
Potassium: 4.6 mmol/L (ref 3.5–5.3)
Sodium: 141 mmol/L (ref 135–146)

## 2015-07-17 LAB — POCT GLYCOSYLATED HEMOGLOBIN (HGB A1C): Hemoglobin A1C: 7

## 2015-07-17 MED ORDER — GABAPENTIN 300 MG PO CAPS: 300.0000 mg | ORAL_CAPSULE | Freq: Three times a day (TID) | ORAL | Status: DC | PRN

## 2015-07-17 NOTE — Patient Instructions (Addendum)
Thank you so much for coming today! For your back pain, you can try the over the counter TENS unit and creams. You may also take Tylenol for pain. Please avoid NSAIDs (such as Aleve or Advil) since you have a history of bleeding. If you would like to start physical therapy, please let me know.  Your A1C is elevated to 7 today. Please continue to work on diet and exercise, which should also help with your back pain. Please follow up in three months so we can check your A1C. A refill of Gabapentin has been given.  I will look through your chart to see which medications you were own for gout in the past. I will send in a prescription for these medications if possible.  We will check your blood level and electrolytes today. I will mail you a letter with the results.  Thanks again! Dr. Caroleen Hammanumley

## 2015-07-18 ENCOUNTER — Encounter: Payer: Self-pay | Admitting: Family Medicine

## 2015-07-19 ENCOUNTER — Telehealth: Payer: Self-pay | Admitting: Family Medicine

## 2015-07-19 NOTE — Telephone Encounter (Signed)
Needs gout pills.  Says it is kicking in now. Was seen in office on 07/18/15 Please advise

## 2015-07-20 MED ORDER — COLCHICINE 0.6 MG PO TABS: 0.6000 mg | ORAL_TABLET | Freq: Two times a day (BID) | ORAL | Status: DC

## 2015-07-20 NOTE — Assessment & Plan Note (Signed)
-   Stable - Continue Norvasc, Coreg, Lisinopril, and Spironolactone - Will check BMP today to check for hyperkalemia given currently taking Lisinopril and Spironolactone

## 2015-07-20 NOTE — Assessment & Plan Note (Signed)
-   Restart Colchicine 0.6mg  BID

## 2015-07-20 NOTE — Assessment & Plan Note (Signed)
Recheck CBC today. 

## 2015-07-20 NOTE — Progress Notes (Signed)
Subjective:     Patient ID: Seth Sanders, male   DOB: Feb 11, 1951, 64 y.o.   MRN: [id number]  HPI [name] is a 64yo male presenting today for medication refill and diabetes visit. # Diabetes: - Reports blood sugars generally range from 90-160s - Currently prescribed Metformin 1000mg  BID - Notes significant callous and nail deformities of feet. Would like referral to Podiatry. - Follows with ophthalmology - Requests refill of Gabapentin - Last A1C 6.1 on 03/22/14  # Back Pain: - Notes back pain since August 2015 - States pain only occurs at work and that he does not have it at other times. 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 - Had been using Flexeril but states this has not been working. Agrees to discontinue medication given no relief. - History of GI bleed, NSAIDs contraindicated - Continues to refuse physical therapy - Plans to buy over the counter TENS unit  # Gout: - Has not been using gout medication since his refills expired - Requests to be restarted on these. Does not remember what medication she used to be on. - Last gout flare last week, mild, self-resolved - Chart review shows previously prescribed Colchicine 0.6mg  BID  # Anemia: - Reports history of GI bleed resulting in anemia - Has been taking Iron supplements - Last BMP with hemoglobin 14.2 in 03/30/14. Noted to be 5.3 in 12/12/12.  # Hypertension: - Stable - Currently prescribed Norvasc, Coreg, Lisinopril, and Spironolactone - Last BMP 03/30/14 without signs of hyperkalemia  Review of Systems Per HPI    Objective:   Physical Exam  Constitutional: He appears well-developed and well-nourished. No distress.  HENT:  Head: Normocephalic and atraumatic.  Cardiovascular: Normal rate and regular rhythm.  Exam reveals no gallop and no friction rub.   No murmur heard. Pulses palpable in feet bilaterally  Pulmonary/Chest: Effort normal. No respiratory distress. He has no wheezes. He has no rales.   Abdominal: Soft. He exhibits no distension. There is no tenderness.  Musculoskeletal: He exhibits no edema.  Neurological: He is alert.  Sensation of feet normal bilaterally  Skin:  Callus formation of feet bilaterally, onychomycosis bilateral toenails  Psychiatric: He has a normal mood and affect. His behavior is normal.       Assessment and Plan:     Essential hypertension - Stable - Continue Norvasc, Coreg, Lisinopril, and Spironolactone - Will check BMP today to check for hyperkalemia given currently taking Lisinopril and Spironolactone  DM (diabetes mellitus) - A1C up to 7 today - Continue Metformin 1000mg  BID - Continue to work on diet and exercise - Refill of gabapentin given - Follow up in three months for A1C check  Gout - Restart Colchicine 0.6mg  BID  History of GI bleed - Recheck CBC today

## 2015-07-20 NOTE — Assessment & Plan Note (Signed)
-   A1C up to 7 today - Continue Metformin 1000mg  BID - Continue to work on diet and exercise - Refill of gabapentin given - Follow up in three months for A1C check

## 2015-07-22 NOTE — Telephone Encounter (Signed)
Prescription sent to pharmacy Friday. Please let me know if he has trouble filling prescription.

## 2015-07-23 ENCOUNTER — Telehealth: Payer: Self-pay | Admitting: Family Medicine

## 2015-07-23 NOTE — Telephone Encounter (Signed)
Pt called and said that the Gout medication that was called in for him was over 200.00 and he can not afford this. He wanted to know what we can call in that is cheaper and that his insurance will cover. jw

## 2015-07-24 MED ORDER — ALLOPURINOL 100 MG PO TABS: 100.0000 mg | ORAL_TABLET | Freq: Every day | ORAL | Status: DC

## 2015-07-24 NOTE — Telephone Encounter (Signed)
Prescription for allopurinol sent to pharmacy. Please let us know if there are any problems obtaining this medication.

## 2015-07-25 NOTE — Telephone Encounter (Signed)
Attempted to call pt.  No answer and no VM.  Will try again later. Delshawn Stech, Maryjo RochesterJessica Dawn

## 2015-07-26 ENCOUNTER — Ambulatory Visit (INDEPENDENT_AMBULATORY_CARE_PROVIDER_SITE_OTHER): Payer: 59 | Admitting: Family Medicine

## 2015-07-26 ENCOUNTER — Encounter: Payer: Self-pay | Admitting: Family Medicine

## 2015-07-26 VITALS — BP 169/71 | HR 67 | Temp 97.8°F | Ht 72.0 in | Wt 310.0 lb

## 2015-07-26 DIAGNOSIS — M545 Low back pain, unspecified: Secondary | ICD-10-CM

## 2015-07-26 DIAGNOSIS — M1A9XX0 Chronic gout, unspecified, without tophus (tophi): Secondary | ICD-10-CM | POA: Diagnosis not present

## 2015-07-26 MED ORDER — TRAMADOL HCL 50 MG PO TABS: 50.0000 mg | ORAL_TABLET | Freq: Three times a day (TID) | ORAL | Status: DC | PRN

## 2015-07-26 NOTE — Telephone Encounter (Signed)
Attempted to call again, same as below.  Will await callback . Safaa Stingley, Maryjo RochesterJessica Dawn

## 2015-07-26 NOTE — Patient Instructions (Signed)
Thank you so much for coming to visit me today! I have sent a prescription for Allopurinol to your pharmacy. Please pick this up. I am also giving you a prescription for Tramadol for your back pain; please use this only when absolutely necessary and use Tylenol otherwise.  Thanks again! Dr. Caroleen Hammanumley

## 2015-07-28 NOTE — Assessment & Plan Note (Signed)
Allopurinol prescribed 

## 2015-07-28 NOTE — Progress Notes (Signed)
Subjective:     Patient ID: Seth CritchleyJames Sanders, male   DOB: 09/24/1950, 64 y.o.   MRN: [id number]  HPI [name] is a 64yo male presenting today to discuss medications. - States gout medication was too expensive - States his phone is not working and when office attempted to contact him stating a different medication had been called in he never received the calls. - Also notes significant back pain, which makes him unable to go to work most days - Pain located over lower back - Pain present x1.5 years - Normally takes Tylenol. Unable to use NSAIDs due to recent bleed. - Has also tried a variety of creams and patches and a TENS unit with minimal relief - Denies incontinence and saddles anesthesia - Reports some relief with Tramadol.   - Currently smokes. Not interested in quitting at this time  Review of Systems Per HPI    Objective:   Physical Exam  Constitutional: He appears well-developed and well-nourished. No distress.  HENT:  Head: Normocephalic and atraumatic.  Mouth/Throat: No oropharyngeal exudate.  Cardiovascular: Normal rate and regular rhythm.  Exam reveals no gallop and no friction rub.   No murmur heard. Pulmonary/Chest: Effort normal. No respiratory distress. He has no wheezes. He has no rales.  Abdominal: Soft. He exhibits no distension. There is no tenderness.  Musculoskeletal: He exhibits no edema.  No saddles anesthesia noted, muscle strength 5/5 bilaterally in upper and lower extremities  Psychiatric: He has a normal mood and affect. His behavior is normal.       Assessment and Plan:     Gout - Allopurinol prescribed  Low back pain - NSAIDs contraindicated due to GI bleed. - Prescription for Tramadol given. Continue Tylenol. Agrees to try Tylenol first and only use Tramadol for severe pain

## 2015-07-28 NOTE — Assessment & Plan Note (Signed)
-   NSAIDs contraindicated due to GI bleed. - Prescription for Tramadol given. Continue Tylenol. Agrees to try Tylenol first and only use Tramadol for severe pain

## 2015-08-01 ENCOUNTER — Telehealth: Payer: Self-pay | Admitting: Family Medicine

## 2015-08-01 DIAGNOSIS — E11628 Type 2 diabetes mellitus with other skin complications: Secondary | ICD-10-CM

## 2015-08-01 NOTE — Telephone Encounter (Signed)
Pt is checking status of referral to have his toe nails trimmed.

## 2015-08-02 NOTE — Telephone Encounter (Signed)
Order placed

## 2015-09-06 ENCOUNTER — Ambulatory Visit: Payer: 59 | Admitting: Podiatry

## 2015-09-26 ENCOUNTER — Other Ambulatory Visit: Payer: Self-pay | Admitting: Family Medicine

## 2015-09-27 NOTE — Telephone Encounter (Signed)
Dr. Adriana Simasook is in a new office, please advise refill for this patient.  Thanks, Kenney Housemananya, RN

## 2015-10-01 ENCOUNTER — Telehealth: Payer: Self-pay | Admitting: Family Medicine

## 2015-10-01 DIAGNOSIS — I10 Essential (primary) hypertension: Secondary | ICD-10-CM

## 2015-10-01 NOTE — Telephone Encounter (Signed)
[facility] told him they couldn't fill his meds because he changed to [employer]. [facility] [address]  Says he is just about out of all of them

## 2015-10-02 MED ORDER — AMLODIPINE BESYLATE 10 MG PO TABS: 10.0000 mg | ORAL_TABLET | Freq: Every day | ORAL | Status: DC

## 2015-10-02 MED ORDER — ALLOPURINOL 100 MG PO TABS: 100.0000 mg | ORAL_TABLET | Freq: Every day | ORAL | Status: DC

## 2015-10-02 MED ORDER — METFORMIN HCL 1000 MG PO TABS: 1000.0000 mg | ORAL_TABLET | Freq: Two times a day (BID) | ORAL | Status: DC

## 2015-10-02 MED ORDER — CARVEDILOL 6.25 MG PO TABS: 6.2500 mg | ORAL_TABLET | Freq: Two times a day (BID) | ORAL | Status: DC

## 2015-10-02 MED ORDER — SPIRONOLACTONE 25 MG PO TABS: 25.0000 mg | ORAL_TABLET | Freq: Every day | ORAL | Status: DC

## 2015-10-02 MED ORDER — LISINOPRIL 40 MG PO TABS: ORAL_TABLET | ORAL | Status: DC

## 2015-10-02 MED ORDER — FERROUS SULFATE 325 (65 FE) MG PO TABS: 325.0000 mg | ORAL_TABLET | Freq: Three times a day (TID) | ORAL | Status: DC

## 2015-10-02 MED ORDER — OMEPRAZOLE 40 MG PO CPDR: 40.0000 mg | DELAYED_RELEASE_CAPSULE | Freq: Every day | ORAL | Status: DC

## 2015-10-02 MED ORDER — GABAPENTIN 300 MG PO CAPS: 300.0000 mg | ORAL_CAPSULE | Freq: Three times a day (TID) | ORAL | Status: DC | PRN

## 2015-10-02 MED ORDER — PRAVASTATIN SODIUM 20 MG PO TABS: 20.0000 mg | ORAL_TABLET | Freq: Every evening | ORAL | Status: DC

## 2015-10-02 MED ORDER — FUROSEMIDE 20 MG PO TABS: 20.0000 mg | ORAL_TABLET | Freq: Two times a day (BID) | ORAL | Status: DC

## 2015-10-02 NOTE — Telephone Encounter (Signed)
Refills sent to new pharmacy.  

## 2015-10-16 ENCOUNTER — Telehealth: Payer: Self-pay | Admitting: Family Medicine

## 2015-10-16 ENCOUNTER — Other Ambulatory Visit: Payer: Self-pay | Admitting: *Deleted

## 2015-10-16 MED ORDER — TRAMADOL HCL 50 MG PO TABS: 50.0000 mg | ORAL_TABLET | Freq: Three times a day (TID) | ORAL | Status: DC | PRN

## 2015-10-16 NOTE — Telephone Encounter (Signed)
LVM for pt to call. Sharon T Saunders, CMA  

## 2015-10-16 NOTE — Telephone Encounter (Signed)
Printed and left at front desk

## 2015-10-16 NOTE — Telephone Encounter (Signed)
Pt has been informed. Seth Sanders, ASA

## 2015-10-16 NOTE — Telephone Encounter (Signed)
Refill request for Tramadol 

## 2015-11-19 ENCOUNTER — Telehealth: Payer: Self-pay | Admitting: Family Medicine

## 2015-11-19 NOTE — Telephone Encounter (Signed)
Needs to get test strips for waves sense?   [facility]  [address] [place].

## 2015-11-22 ENCOUNTER — Telehealth: Payer: Self-pay | Admitting: Family Medicine

## 2015-11-22 MED ORDER — ACCU-CHEK AVIVA PLUS W/DEVICE KIT: PACK | Status: DC

## 2015-11-22 MED ORDER — GLUCOSE BLOOD VI STRP: ORAL_STRIP | Status: DC

## 2015-11-22 NOTE — Telephone Encounter (Signed)
Pt calls again, has not checked sugars in about a week now, waiting for supplies. Please send.

## 2015-11-22 NOTE — Telephone Encounter (Signed)
error 

## 2015-11-22 NOTE — Telephone Encounter (Signed)
Done

## 2016-04-07 ENCOUNTER — Other Ambulatory Visit: Payer: Self-pay | Admitting: *Deleted

## 2016-04-07 MED ORDER — GABAPENTIN 300 MG PO CAPS: 300.0000 mg | ORAL_CAPSULE | Freq: Three times a day (TID) | ORAL | Status: DC | PRN

## 2016-06-01 ENCOUNTER — Other Ambulatory Visit: Payer: Self-pay | Admitting: Family Medicine

## 2016-06-29 ENCOUNTER — Other Ambulatory Visit: Payer: Self-pay | Admitting: Family Medicine

## 2016-07-29 ENCOUNTER — Other Ambulatory Visit: Payer: Self-pay | Admitting: Family Medicine

## 2016-08-18 ENCOUNTER — Ambulatory Visit: Payer: Self-pay | Admitting: Family Medicine

## 2016-09-29 ENCOUNTER — Encounter: Payer: Self-pay | Admitting: Family Medicine

## 2016-09-29 ENCOUNTER — Ambulatory Visit (INDEPENDENT_AMBULATORY_CARE_PROVIDER_SITE_OTHER): Payer: Medicare Other | Admitting: Family Medicine

## 2016-09-29 VITALS — BP 188/86 | HR 61 | Temp 98.5°F | Ht 72.0 in | Wt 293.8 lb

## 2016-09-29 DIAGNOSIS — D509 Iron deficiency anemia, unspecified: Secondary | ICD-10-CM

## 2016-09-29 DIAGNOSIS — E11628 Type 2 diabetes mellitus with other skin complications: Secondary | ICD-10-CM | POA: Diagnosis not present

## 2016-09-29 DIAGNOSIS — I10 Essential (primary) hypertension: Secondary | ICD-10-CM | POA: Diagnosis not present

## 2016-09-29 DIAGNOSIS — Z0000 Encounter for general adult medical examination without abnormal findings: Secondary | ICD-10-CM | POA: Diagnosis not present

## 2016-09-29 LAB — POCT GLYCOSYLATED HEMOGLOBIN (HGB A1C): Hemoglobin A1C: 6.2

## 2016-09-29 MED ORDER — TRAMADOL HCL 50 MG PO TABS: 50.0000 mg | ORAL_TABLET | Freq: Three times a day (TID) | ORAL | 0 refills | Status: DC | PRN

## 2016-09-29 MED ORDER — GABAPENTIN 300 MG PO CAPS: 300.0000 mg | ORAL_CAPSULE | Freq: Three times a day (TID) | ORAL | 1 refills | Status: DC | PRN

## 2016-09-29 MED ORDER — PRAVASTATIN SODIUM 20 MG PO TABS: 20.0000 mg | ORAL_TABLET | Freq: Every evening | ORAL | 3 refills | Status: DC

## 2016-09-29 MED ORDER — CARVEDILOL 6.25 MG PO TABS: 6.2500 mg | ORAL_TABLET | Freq: Two times a day (BID) | ORAL | 3 refills | Status: DC

## 2016-09-29 MED ORDER — METFORMIN HCL 1000 MG PO TABS: 1000.0000 mg | ORAL_TABLET | Freq: Two times a day (BID) | ORAL | 11 refills | Status: DC

## 2016-09-29 MED ORDER — LISINOPRIL 40 MG PO TABS
ORAL_TABLET | ORAL | 3 refills | Status: DC
Start: 2016-09-29 — End: 2017-09-27

## 2016-09-29 MED ORDER — AMLODIPINE BESYLATE 10 MG PO TABS: 10.0000 mg | ORAL_TABLET | Freq: Every day | ORAL | 3 refills | Status: DC

## 2016-09-29 MED ORDER — SPIRONOLACTONE 50 MG PO TABS: 50.0000 mg | ORAL_TABLET | Freq: Every day | ORAL | 3 refills | Status: DC

## 2016-09-29 NOTE — Progress Notes (Signed)
Subjective:     Patient ID: Seth Sanders, male   DOB: 1950-10-04, 66 y.o.   MRN: [id number]  HPI Seth Sanders is a 66yo male presenting today for follow up of diabetes and hypertension. Note last office visit 07/2015. Medications brought for review and updated in Epic. Note that number of pills in bottles are not consistent with what should be left if taking appropriately, however he states he is taking all medications as prescribed and not missing doses. Does note he is out of Pravastatin. Requests refill of Pravastatin, Carvedilol, Tramadol (only takes for back pain a few times per month), Gabapentin, Lisinopril, Amlodipine, and Metformin. Has been dieting and exercising and excited about his weight loss results. Was 310pounds in 07/2015 and is now 293 pounds. Does continue to smoke and not interested in quitting at this time. Reports history of Iron Deficiency Anemia and used to take iron--he would like labs today to determine if this is still needed.  Last CBC in 06/2015 normal, last Iron Panel 11/2012. Reports colonoscopy in 2014, but does not remember the findings. Denies chest pain, headache, dyspnea at rest or exertion, edema.   Review of Systems Per HPI    Objective:   Physical Exam  Constitutional: He appears well-developed and well-nourished. No distress.  HENT:  Head: Normocephalic and atraumatic.  Eyes:  Blind left eye  Cardiovascular: Normal rate and regular rhythm.   Murmur heard. Pedal pulses palpable  Pulmonary/Chest: Effort normal. No respiratory distress. He has no wheezes.  Abdominal: Soft. He exhibits no distension. There is no tenderness.  Musculoskeletal: He exhibits no edema.  Neurological:  Sensation intact over feet bilaterally  Skin: No rash noted.  Callous formation bilaterally  Psychiatric: He has a normal mood and affect. His behavior is normal.      Assessment and Plan:     Essential hypertension Elevated to 192/78 and then 188/86 on repeat. Heart rate 61  today and chart review shows he has been in 50-60s over the last two years. Currently on Coreg, Lisinopril, Amlodipine, Furosemide, and Spironolactone. Lisinopril and Amlodipine at maximum dose. Would caution increasing Coreg given heart rate. Will increase Spironolactone to 50mg  daily. Monitor potassium closely since he is taking both Lisinopril and Spironolactone, however Furosemide should help some with this. Will check BMP today. Follow up in one week for nurse visit and return in 2-3 weeks for office visit.   DM (diabetes mellitus) A1C 6.2 today, improved from 7 in 06/2015. Continue Metformin.  Healthcare maintenance Reports colonoscopy in 2014. Will check Lipid Panel today. Given history of anemia, will also check Iron Panel and CBC. Will check HIV and Hepatitis C once.

## 2016-09-29 NOTE — Patient Instructions (Signed)
Thank you so much for coming to visit today! I have sent refills for Carvedilol, Tramadol, Gabapentin, Lisinopril, Spironolactone, Amlodipine, Metformin, and Pravastatin to the pharmacy.  We will increase your Spironolactone to 50mg  daily. You may take two tablets of the bottle you have (25mg ) until you run out. Please return in 1 week for a nurse visit to recheck your blood pressure and again in 2-3 weeks for an office visit.  We will check several labs today. You need to go to [facility] across the street to have these done.   Dr. Caroleen Hammanumley

## 2016-09-29 NOTE — Assessment & Plan Note (Signed)
A1C 6.2 today, improved from 7 in 06/2015. Continue Metformin.

## 2016-09-29 NOTE — Assessment & Plan Note (Signed)
Reports colonoscopy in 2014. Will check Lipid Panel today. Given history of anemia, will also check Iron Panel and CBC. Will check HIV and Hepatitis C once.

## 2016-09-29 NOTE — Assessment & Plan Note (Addendum)
Elevated to 192/78 and then 188/86 on repeat. Heart rate 61 today and chart review shows he has been in 50-60s over the last two years. Currently on Coreg, Lisinopril, Amlodipine, Furosemide, and Spironolactone. Lisinopril and Amlodipine at maximum dose. Would caution increasing Coreg given heart rate. Will increase Spironolactone to 50mg  daily. Monitor potassium closely since he is taking both Lisinopril and Spironolactone, however Furosemide should help some with this. Will check BMP today. Follow up in one week for nurse visit and return in 2-3 weeks for office visit.

## 2016-09-30 ENCOUNTER — Other Ambulatory Visit: Payer: Self-pay | Admitting: Family Medicine

## 2016-10-05 NOTE — Progress Notes (Signed)
Cancelling order as lab not collected, no need to re-order 

## 2016-10-10 ENCOUNTER — Other Ambulatory Visit: Payer: Self-pay | Admitting: Family Medicine

## 2016-10-12 ENCOUNTER — Other Ambulatory Visit: Payer: Self-pay | Admitting: Family Medicine

## 2016-10-12 MED ORDER — METFORMIN HCL 1000 MG PO TABS: 1000.0000 mg | ORAL_TABLET | Freq: Two times a day (BID) | ORAL | 3 refills | Status: DC

## 2016-10-12 NOTE — Telephone Encounter (Signed)
Refill request for 90 day supply.  Martin, Tamika L, RN  

## 2016-10-26 ENCOUNTER — Other Ambulatory Visit: Payer: Self-pay | Admitting: Family Medicine

## 2016-10-28 ENCOUNTER — Other Ambulatory Visit: Payer: Self-pay | Admitting: Family Medicine

## 2016-10-30 NOTE — Telephone Encounter (Signed)
2nd request.  Nicholi Ghuman L, RN  

## 2016-11-26 ENCOUNTER — Other Ambulatory Visit: Payer: Self-pay | Admitting: Family Medicine

## 2016-11-26 DIAGNOSIS — I10 Essential (primary) hypertension: Secondary | ICD-10-CM

## 2016-12-04 ENCOUNTER — Other Ambulatory Visit: Payer: Self-pay | Admitting: Family Medicine

## 2016-12-24 ENCOUNTER — Other Ambulatory Visit: Payer: Self-pay | Admitting: Family Medicine

## 2016-12-24 NOTE — Telephone Encounter (Signed)
Needs refills on needles and test strips.  [facility] [address]

## 2016-12-28 NOTE — Telephone Encounter (Signed)
2nd request.  Edlyn Rosenburg L, RN  

## 2016-12-29 NOTE — Telephone Encounter (Signed)
3rd request. Martin, Tamika L, RN  

## 2016-12-30 NOTE — Telephone Encounter (Signed)
Lancets and test strips refilled

## 2016-12-30 NOTE — Telephone Encounter (Signed)
This is the 4th time that the patient is calling for a refill on his needles. Please do this today. Seth Sanders

## 2017-01-22 ENCOUNTER — Telehealth: Payer: Self-pay

## 2017-01-22 NOTE — Telephone Encounter (Signed)
Pharmacy sent fax for Omega 3. I asked them to resend fax. Sunday SpillersSharon T Glendal Cassaday, CMA

## 2017-05-14 ENCOUNTER — Ambulatory Visit: Payer: Medicare Other | Admitting: Family Medicine

## 2017-06-10 ENCOUNTER — Other Ambulatory Visit: Payer: Self-pay | Admitting: *Deleted

## 2017-06-14 MED ORDER — TRAMADOL HCL 50 MG PO TABS
50.0000 mg | ORAL_TABLET | Freq: Three times a day (TID) | ORAL | 0 refills | Status: DC | PRN
Start: 2017-06-14 — End: 2018-04-22

## 2017-07-13 ENCOUNTER — Other Ambulatory Visit: Payer: Self-pay | Admitting: Family Medicine

## 2017-07-22 ENCOUNTER — Other Ambulatory Visit: Payer: Self-pay | Admitting: *Deleted

## 2017-07-22 MED ORDER — METFORMIN HCL 1000 MG PO TABS: 1000.0000 mg | ORAL_TABLET | Freq: Two times a day (BID) | ORAL | 3 refills | Status: DC

## 2017-09-27 ENCOUNTER — Other Ambulatory Visit: Payer: Self-pay | Admitting: Family Medicine

## 2017-09-27 NOTE — Telephone Encounter (Signed)
Needs refills on lisinopril and carvedilol.   [facility] [address]

## 2017-09-28 MED ORDER — LISINOPRIL 40 MG PO TABS: ORAL_TABLET | ORAL | 3 refills | Status: DC

## 2017-09-28 MED ORDER — CARVEDILOL 6.25 MG PO TABS: 6.2500 mg | ORAL_TABLET | Freq: Two times a day (BID) | ORAL | 3 refills | Status: DC

## 2017-10-25 ENCOUNTER — Telehealth: Payer: Self-pay | Admitting: Family Medicine

## 2017-10-25 ENCOUNTER — Other Ambulatory Visit: Payer: Self-pay | Admitting: Family Medicine

## 2017-10-25 MED ORDER — METFORMIN HCL 1000 MG PO TABS: 1000.0000 mg | ORAL_TABLET | Freq: Two times a day (BID) | ORAL | 3 refills | Status: DC

## 2017-10-25 NOTE — Telephone Encounter (Signed)
Pt needs refill on metformin. Pharmacy has been faxing requests to PCP.

## 2017-10-25 NOTE — Telephone Encounter (Signed)
I refilled his metformin.  Thanks!

## 2017-10-25 NOTE — Telephone Encounter (Signed)
[facility] Rx is for [facility] tabs, with instructions to take 1 tablet by mouth twice daily with a meal. 3 refills. Is this correct?  Wasn't sure if it should be for 60 tabs or 180, or take 3 times a day. Also, the pt has not been into the office since 09/29/2016. Please advise. Sunday SpillersSharon T Saunders, CMA

## 2017-10-29 NOTE — Telephone Encounter (Signed)
Thanks Melvenia BeamShari, he should come in to be seen.  I will check with him at that time to see what dose of metformin he needs.  Thank you!

## 2017-11-05 ENCOUNTER — Other Ambulatory Visit: Payer: Self-pay

## 2017-11-05 ENCOUNTER — Other Ambulatory Visit: Payer: Self-pay | Admitting: *Deleted

## 2017-11-05 MED ORDER — SPIRONOLACTONE 50 MG PO TABS: 50.0000 mg | ORAL_TABLET | Freq: Every day | ORAL | 3 refills | Status: DC

## 2017-11-05 NOTE — Telephone Encounter (Signed)
Patient left message on nurse line that he is still waiting on Spironolactone refill. States pharmacy requested it several days ago. Ples SpecterAlisa Rya Rausch, RN [place]([facility] RN)

## 2017-11-08 ENCOUNTER — Other Ambulatory Visit: Payer: Self-pay | Admitting: Student

## 2017-11-08 ENCOUNTER — Other Ambulatory Visit: Payer: Self-pay

## 2017-11-08 ENCOUNTER — Encounter: Payer: Self-pay | Admitting: Student

## 2017-11-08 ENCOUNTER — Ambulatory Visit (INDEPENDENT_AMBULATORY_CARE_PROVIDER_SITE_OTHER): Payer: 59 | Admitting: Student

## 2017-11-08 VITALS — BP 160/90 | HR 53 | Temp 98.5°F | Ht 72.0 in | Wt 309.2 lb

## 2017-11-08 DIAGNOSIS — Z1159 Encounter for screening for other viral diseases: Secondary | ICD-10-CM

## 2017-11-08 DIAGNOSIS — E118 Type 2 diabetes mellitus with unspecified complications: Secondary | ICD-10-CM

## 2017-11-08 DIAGNOSIS — I10 Essential (primary) hypertension: Secondary | ICD-10-CM

## 2017-11-08 DIAGNOSIS — I5032 Chronic diastolic (congestive) heart failure: Secondary | ICD-10-CM | POA: Diagnosis not present

## 2017-11-08 DIAGNOSIS — Z23 Encounter for immunization: Secondary | ICD-10-CM | POA: Diagnosis not present

## 2017-11-08 DIAGNOSIS — F17200 Nicotine dependence, unspecified, uncomplicated: Secondary | ICD-10-CM

## 2017-11-08 DIAGNOSIS — Z1329 Encounter for screening for other suspected endocrine disorder: Secondary | ICD-10-CM

## 2017-11-08 DIAGNOSIS — Z9189 Other specified personal risk factors, not elsewhere classified: Secondary | ICD-10-CM | POA: Insufficient documentation

## 2017-11-08 DIAGNOSIS — Z79899 Other long term (current) drug therapy: Secondary | ICD-10-CM

## 2017-11-08 DIAGNOSIS — Z862 Personal history of diseases of the blood and blood-forming organs and certain disorders involving the immune mechanism: Secondary | ICD-10-CM

## 2017-11-08 DIAGNOSIS — Z720 Tobacco use: Secondary | ICD-10-CM | POA: Insufficient documentation

## 2017-11-08 HISTORY — DX: Personal history of diseases of the blood and blood-forming organs and certain disorders involving the immune mechanism: Z86.2

## 2017-11-08 HISTORY — DX: Other specified personal risk factors, not elsewhere classified: Z91.89

## 2017-11-08 MED ORDER — HYDROCHLOROTHIAZIDE 12.5 MG PO TABS
12.5000 mg | ORAL_TABLET | Freq: Every day | ORAL | 0 refills | Status: DC
Start: 2017-11-08 — End: 2017-11-08

## 2017-11-08 MED ORDER — FUROSEMIDE 20 MG PO TABS: 20.0000 mg | ORAL_TABLET | Freq: Two times a day (BID) | ORAL | 3 refills | Status: DC | PRN

## 2017-11-08 MED ORDER — TETANUS-DIPHTH-ACELL PERTUSSIS 5-2.5-18.5 LF-MCG/0.5 IM SUSP: 0.5000 mL | Freq: Once | INTRAMUSCULAR | 0 refills | Status: AC

## 2017-11-08 NOTE — Progress Notes (Signed)
Subjective:    Seth Sanders is a 67 y.o. old male here for medication refills.  He brought his medication bottles to this visit.  HPI Diabetes: checks his blood glucose at home. Premeal ranges from 119-125. He checks his blood glucose twice a day. Takes metformin 1000 mg twice a day. Good compliance. No side effects. Not active. Doesn't eat fries. Eats one to two serving vegetables. Drinks diet soda. Denies juice. Occ EtOH. Smokes half-a-pack a day. Not ready to quit.   Hypertension: on amlodipine 10 mg, coreg 6.25 mg twice a day , lisinopril 40 mg & lasix 20 mg twice a day  and spironolactone 50 mg. He says he has been out of spironolactone for 5-6 days. He reports good compliance with his medication. Watches his salt intake. Denies pain medicine over the counter.  Denies chest pain, shortness of breath, orthopnea or leg swelling.  Reports mild headache but denies vision change, focal weakness, numbness or tingling.   PMH/Problem List: has Essential hypertension; Severe obesity (BMI >= 40) (HCC); DM (diabetes mellitus) (HCC); Hyperlipidemia; Diastolic CHF (HCC); Gout; Systolic murmur; Low back pain; History of GI bleed; and Healthcare maintenance on their problem list.   has a past medical history of CHF (congestive heart failure) (HCC), Diabetes mellitus without complication (HCC), Gout, Heart murmur, Hyperlipidemia, and Hypertension.  FH:  No family history on file.  SH Social History   Tobacco Use  . Smoking status: Current Every Day Smoker    Packs/day: 0.50    Years: 35.00    Pack years: 17.50    Types: Cigarettes  . Smokeless tobacco: Never Used  . Tobacco comment:  Not interested now in quitting/ls  Substance Use Topics  . Alcohol use: No  . Drug use: No    Review of Systems Review of systems negative except for pertinent positives and negatives in history of present illness above.     Objective:     Vitals:   11/08/17 0826 11/08/17 0903  BP: (!) 180/90 (!) 160/90    Pulse: (!) 53   Temp: 98.5 F (36.9 C)   TempSrc: Oral   SpO2: 98%   Weight: (!) 309 lb 3.2 oz (140.3 kg)   Height: 6' (1.829 m)    Body mass index is 41.94 kg/m.  Physical Exam  GEN: appears well, no apparent distress. Head: normocephalic and atraumatic  Eyes: Cataract in left eye. Oropharynx: Poor dentition. HEM: negative for cervical or periauricular lymphadenopathies CVS: Bradycardia to 54, RR, nl s1 & s2, 2/6 SEM more over RUSB, no edema RESP: no IWOB, good air movement bilaterally, CTAB GI: Morbidly obese, bowel sounds normal MSK: no focal tenderness or notable swelling SKIN: no apparent skin lesion ENDO: negative thyromegally NEURO: alert and oiented appropriately, no gross deficits  PSYCH: euthymic mood with congruent affect    Assessment and Plan:  1. Type 2 diabetes mellitus with complication, without long-term current use of insulin (HCC): Last A1c 6.2% about a year ago.  Good compliance with his metformin.  Pre-meal CBG ranges from 119-125 at home.  Denies symptoms of hyper or hypoglycemia.  Unfortunately, he has sedentary lifestyle.  We discussed about the importance of lifestyle including exercise and diet.  I gave him handout as well.  He continues to smoke, and not ready to quit.  He is on lisinopril for renal protection.  He is on pravastatin.  I feel he may need moderate to high intensity statin but will await until lipid panel is back.  Discussed about  the importance of annual eye exam and routine dental checkup.  - Foot exam at next visit in 2 weeks. - Hemoglobin A1c - Check vitamin B12 level.  2. Essential hypertension: Not well controlled.  Initial blood pressure 180/90.  Repeat blood pressure 160/90.  Reports good compliance with medication except that he has been out of his Spironolactone for almost a week.  I am not sure about the indication of carvedilol and Spironolactone with normal ejection fraction.  He is bradycardic to 53.  Will stop spironolactone  and start hydrochlorothiazide.  We may consider stopping carvedilol and titrating his other blood pressure medication as needed.  He reports snoring, daytime fatigue and sleepiness.  He appears to be high risk for sleep apnea.  Referral to sleep medicine ordered.  - Basic metabolic panel - hydrochlorothiazide (HYDRODIURIL) 12.5 MG tablet; Take 1 tablet (12.5 mg total) by mouth daily.  Dispense: 30 tablet; Refill: 0  3. At risk for obstructive sleep apnea - Ambulatory referral to Sleep Studies  4. Chronic diastolic congestive heart failure [facility](HCC): denies cardiopulmonary symptoms.  He has no signs of fluid overload. - furosemide (LASIX) 20 MG tablet; Take 1 tablet (20 mg total) by mouth 2 (two) times daily as needed.  Dispense: 180 tablet; Refill: 3  5. Screening for thyroid disorder - TSH  6. History of anemia - CBC with Differential/Platelet  7. Need for hepatitis C screening test - Hepatitis C antibody  8. Encounter for immunization - Tdap (BOOSTRIX) 5-2.5-18.5 LF-MCG/0.5 injection; Inject 0.5 mLs into the muscle once for 1 dose.  Dispense: 0.5 mL; Refill: 0  9. Tobacco use disorder: discussed the impact of this on his health.  Unfortunately, he is not ready to quit. He says " I already 67 years of age".  Advised him to quit smoking.  Gave him a quit line phone number.   Medication reviewed and updated in his EMR.  Return in about 2 weeks (around 11/22/2017) for Hypertension and diabetes.  Almon Herculesaye T Gonfa, MD 11/08/17 Pager: [phone]

## 2017-11-08 NOTE — Patient Instructions (Signed)
It was great seeing you today! We have addressed the following issues today  Diabetes: We are checking you A1c today.  Continue taking your metformin.  We also recommend lifestyle change including diet and exercise as below.  You do not need to check your blood glucose at home.  Please call your eye doctor and your dentist to schedule for your routine eye exam and dental checkup.  Blood pressure: Your blood pressure is 160/90.  Your goal blood pressure is less than 140/90.  We stopped the spironolactone.  We started a new blood pressure medicine called hydrochlorothiazide.  Continue taking your other blood pressure medications.  We also ordered a referral to sleep clinic to rule out obstructive sleep apnea.  See below for more on this.  Smoking: I strongly recommend you quit smoking. Call 1800-QUIT-NOW for help with stopping smoking.   If we did any lab work today, and the results require attention, either me or my nurse will get in touch with you. If everything is normal, you will get a letter in mail and a message via . If you don't hear from us in two weeks, please give us a call. Otherwise, we look forward to seeing you again at your next visit. If you have any questions or concerns before then, please call the clinic at [phone].  Please bring all your medications to every doctors visit  Sign up for My Chart to have easy access to your labs results, and communication with your Primary care physician.    Please check-out at the front desk before leaving the clinic.    What is A1c:  The A1C test result reflects your average blood sugar level for the past two to three months. Specifically, the A1C test measures what percentage of your hemoglobin - a protein in red blood cells that carries oxygen - is coated with sugar (glycated). The higher your A1C level, the poorer your blood sugar control and the higher your risk of diabetes complications. Portion Size    Choose healthier foods  such as 100% whole grains, vegetables, fruits, beans, nut seeds, olive oil, most vegetable oils, fat-free dietary, wild game and fish.   Avoid sweet tea, other sweetened beverages, soda, fruit juice, cold cereal and milk and trans fat.   Eat at least 3 meals and 1-2 snacks per day.  Aim for no more than 5 hours between eating.  Eat breakfast within one hour of getting up.    Exercise at least 150 minutes per week, including weight resistance exercises 3 or 4 times per week.   Try to lose at least 7-10% of your current body weight.   Sleep Apnea Sleep apnea is a condition that affects breathing. People with sleep apnea have moments during sleep when their breathing pauses briefly or gets shallow. Sleep apnea can cause these symptoms:  Trouble staying asleep.  Sleepiness or tiredness during the day.  Irritability.  Loud snoring.  Morning headaches.  Trouble concentrating.  Forgetting things.  Less interest in sex.  Being sleepy for no reason.  Mood swings.  Personality changes.  Depression.  Waking up a lot during the night to pee (urinate).  Dry mouth.  Sore throat.  Follow these instructions at home:  Make any changes in your routine that your doctor recommends.  Eat a healthy, well-balanced diet.  Take over-the-counter and prescription medicines only as told by your doctor.  Avoid using alcohol, calming medicines (sedatives), and narcotic medicines.  Take steps to lose weight  if you are overweight.  If you were given a machine (device) to use while you sleep, use it only as told by your doctor.  Do not use any tobacco products, such as cigarettes, chewing tobacco, and e-cigarettes. If you need help quitting, ask your doctor.  Keep all follow-up visits as told by your doctor. This is important. Contact a doctor if:  The machine that you were given to use during sleep is uncomfortable or does not seem to be working.  Your symptoms do not get  better.  Your symptoms get worse. Get help right away if:  Your chest hurts.  You have trouble breathing in enough air (shortness of breath).  You have an uncomfortable feeling in your back, arms, or stomach.  You have trouble talking.  One side of your body feels weak.  A part of your face is hanging down (drooping). These symptoms may be an emergency. Do not wait to see if the symptoms will go away. Get medical help right away. Call your local emergency services (911 in the [place] Do not drive yourself to the hospital. This information is not intended to replace advice given to you by your health care provider. Make sure you discuss any questions you have with your health care provider. Document Released: 06/16/2008 Document Revised: 05/03/2016 Document Reviewed: 06/17/2015 Elsevier Interactive Patient Education  [employer].

## 2017-11-09 ENCOUNTER — Encounter: Payer: Self-pay | Admitting: Student

## 2017-11-09 LAB — HEMOGLOBIN A1C
Est. average glucose Bld gHb Est-mCnc: 137 mg/dL
Hgb A1c MFr Bld: 6.4 % — ABNORMAL HIGH (ref 4.8–5.6)

## 2017-11-09 LAB — CBC WITH DIFFERENTIAL/PLATELET
Basophils Absolute: 0.1 10*3/uL (ref 0.0–0.2)
Basos: 1 %
EOS (ABSOLUTE): 0.2 10*3/uL (ref 0.0–0.4)
Eos: 2 %
Hematocrit: 41.4 % (ref 37.5–51.0)
Hemoglobin: 13.7 g/dL (ref 13.0–17.7)
Immature Grans (Abs): 0 10*3/uL (ref 0.0–0.1)
Immature Granulocytes: 0 %
Lymphocytes Absolute: 2.3 10*3/uL (ref 0.7–3.1)
Lymphs: 29 %
MCH: 30.3 pg (ref 26.6–33.0)
MCHC: 33.1 g/dL (ref 31.5–35.7)
MCV: 92 fL (ref 79–97)
Monocytes Absolute: 0.5 10*3/uL (ref 0.1–0.9)
Monocytes: 7 %
Neutrophils Absolute: 4.9 10*3/uL (ref 1.4–7.0)
Neutrophils: 61 %
Platelets: 192 10*3/uL (ref 150–379)
RBC: 4.52 x10E6/uL (ref 4.14–5.80)
RDW: 14.2 % (ref 12.3–15.4)
WBC: 8 10*3/uL (ref 3.4–10.8)

## 2017-11-09 LAB — BASIC METABOLIC PANEL
BUN/Creatinine Ratio: 17 (ref 10–24)
BUN: 12 mg/dL (ref 8–27)
CO2: 23 mmol/L (ref 20–29)
Calcium: 9.7 mg/dL (ref 8.6–10.2)
Chloride: 104 mmol/L (ref 96–106)
Creatinine, Ser: 0.71 mg/dL — ABNORMAL LOW (ref 0.76–1.27)
GFR calc Af Amer: 113 mL/min/{1.73_m2} (ref >59)
GFR calc non Af Amer: 98 mL/min/{1.73_m2} (ref >59)
Glucose: 133 mg/dL — ABNORMAL HIGH (ref 65–99)
Potassium: 4.1 mmol/L (ref 3.5–5.2)
Sodium: 143 mmol/L (ref 134–144)

## 2017-11-09 LAB — TSH: TSH: 1.36 u[IU]/mL (ref 0.450–4.500)

## 2017-11-09 LAB — HEPATITIS C ANTIBODY: Hep C Virus Ab: <0.1 s/co ratio (ref 0.0–0.9)

## 2017-11-09 LAB — VITAMIN B12: Vitamin B-12: 358 pg/mL (ref 232–1245)

## 2017-11-22 ENCOUNTER — Other Ambulatory Visit: Payer: Self-pay | Admitting: Family Medicine

## 2017-11-22 DIAGNOSIS — I10 Essential (primary) hypertension: Secondary | ICD-10-CM

## 2017-11-22 NOTE — Telephone Encounter (Signed)
Pt calling about his medication refills. He said the pharmacy has been trying to get refills for his amlodipine and spironolactone, but he hasn't been able to get them yet. I told him spironolactone is no longer in his current med list and he said he's always taken it and doesn't know why he would have been taken off. Please contact pt to discuss this.

## 2017-11-22 NOTE — Telephone Encounter (Signed)
Family Medicine Telephone note  Covering inbox for Dr. Frances FurbishWinfrey this week. Called patient and discussed htn regimen. Looks like he was taken off spiro as unclear indication for him in systolic heart failure. He was started on HCTZ. Was to follow up in 2 weeks. Unfortunately had something come up and had to be seen a little later (appointment scheduled on 3/11). He is out of amlodipine and hctz. Patient voiced understanding for why he was taken off spiro and importance for current regimen. Will send in refill for amlodipine and hctz to pharmacy. Will defer other medication management to appt on 3/11 with Dr. Frances FurbishWinfrey.  Myrene BuddyJacob Nashaly Dorantes MD PGY-1 Family Medicine Resident

## 2017-11-29 ENCOUNTER — Ambulatory Visit: Payer: 59 | Admitting: Family Medicine

## 2017-12-06 ENCOUNTER — Other Ambulatory Visit: Payer: Self-pay

## 2017-12-06 DIAGNOSIS — I5032 Chronic diastolic (congestive) heart failure: Secondary | ICD-10-CM

## 2017-12-07 MED ORDER — FUROSEMIDE 20 MG PO TABS: 20.0000 mg | ORAL_TABLET | Freq: Two times a day (BID) | ORAL | 3 refills | Status: DC | PRN

## 2017-12-08 ENCOUNTER — Encounter: Payer: Self-pay | Admitting: Family Medicine

## 2017-12-08 ENCOUNTER — Ambulatory Visit (INDEPENDENT_AMBULATORY_CARE_PROVIDER_SITE_OTHER): Payer: 59 | Admitting: Family Medicine

## 2017-12-08 VITALS — BP 165/80 | HR 57 | Temp 98.9°F | Ht 72.0 in | Wt 312.2 lb

## 2017-12-08 DIAGNOSIS — I10 Essential (primary) hypertension: Secondary | ICD-10-CM | POA: Diagnosis not present

## 2017-12-08 DIAGNOSIS — Z0000 Encounter for general adult medical examination without abnormal findings: Secondary | ICD-10-CM

## 2017-12-08 DIAGNOSIS — E119 Type 2 diabetes mellitus without complications: Secondary | ICD-10-CM | POA: Diagnosis not present

## 2017-12-08 MED ORDER — SPIRONOLACTONE 25 MG PO TABS: 25.0000 mg | ORAL_TABLET | Freq: Every day | ORAL | 11 refills | Status: DC

## 2017-12-08 MED ORDER — HYDROCHLOROTHIAZIDE 12.5 MG PO TABS: ORAL_TABLET | ORAL | 3 refills | Status: DC

## 2017-12-08 MED ORDER — ATORVASTATIN CALCIUM 40 MG PO TABS: 40.0000 mg | ORAL_TABLET | Freq: Every day | ORAL | 11 refills | Status: DC

## 2017-12-08 NOTE — Assessment & Plan Note (Signed)
Will restart spironolactone 25 mg since patient is already on max doses of lisinopril and amlodipine and does not want further diuresis from an increase in his HCTZ dose.  Would like to see patient in 2-3 weeks to reassess his BP and medication regimen.  Patient will continue measuring BP at home.

## 2017-12-08 NOTE — Assessment & Plan Note (Addendum)
Patient's 10-year ASCVD risk is 55% using his lipid values from 2015, which were not significantly elevated.  Will obtain lipid panel today and switch his statin from pravastatin to Lipitor 40 mg since he would benefit from a high-intensity statin.  Will not start baby ASA at this time due to patient's GI bleed two years ago.

## 2017-12-08 NOTE — Progress Notes (Signed)
   Subjective:    Seth Sanders - [age] y.o. male MRN [id number]  Date of birth: 17-Apr-1951  HPI  Seth Sanders is here for follow up of his high blood pressure and diabetes.  Hypertension - started on HCTZ 12.5 mg in February - spironolactone was stopped since it is not indicated for diastolic CHF - takes amlodipine 10 mg, lisinopril 40 mg, Coreg 6.125 mg, HCTZ 12.5 mg as prescribed - home BP measurements are consistently elevated, with systolic readings ranging from 135-190, diastolic usually 40-9880-90  Diabetes - continues to take metformin as prescribed - declines foot exam today - Hgb A1c 6.4 in Feb 2018 - takes pravastatin 20 mg  - concerned about his weight gain, wants to get a job soon, which he thinks will help him be more active and lose weight  Health Maintenance:  Health Maintenance Due  Topic Date Due  . TETANUS/TDAP  05/11/1970  . COLONOSCOPY  05/11/2001  . OPHTHALMOLOGY EXAM  01/19/2014  . FOOT EXAM  03/23/2015    -  reports that he has been smoking cigarettes.  He has a 17.50 pack-year smoking history. he has never used smokeless tobacco. - Review of Systems: Per HPI. - Past Medical History: Patient Active Problem List   Diagnosis Date Noted  . At risk for obstructive sleep apnea 11/08/2017  . History of anemia 11/08/2017  . Tobacco use disorder 11/08/2017  . Healthcare maintenance 09/29/2016  . History of GI bleed 08/01/2014  . Systolic murmur 03/22/2014  . Low back pain 03/22/2014  . Gout 01/26/2013  . Severe obesity (BMI >= 40) (HCC) 09/08/2012  . DM (diabetes mellitus) (HCC) 09/08/2012  . Hyperlipidemia 09/08/2012  . Diastolic CHF (HCC) 09/08/2012  . Essential hypertension 04/10/2009   - Medications: reviewed and updated   Objective:   Physical Exam BP (!) 165/80   Pulse (!) 57   Temp 98.9 F (37.2 C) (Oral)   Ht 6' (1.829 m)   Wt (!) 312 lb 3.2 oz (141.6 kg)   SpO2 99%   BMI 42.34 kg/m  Gen: NAD, alert, cooperative with exam,  well-appearing HEENT: NCAT, PERRL, clear conjunctiva, oropharynx clear, supple neck, cataract in L eye CV: regular rhythm, bradycardic to 50 bmp, good S1/S2, 2/6 systolic murmur, no edema, capillary refill brisk  Resp: CTABL, no wheezes, non-labored Abd: SNTND, BS present, no guarding or organomegaly Skin: no rashes, normal turgor  Neuro: no gross deficits.  Psych: good insight, alert and oriented  Assessment & Plan:   Healthcare maintenance Patient's 10-year ASCVD risk is 55% using his lipid values from 2015, which were not significantly elevated.  Will obtain lipid panel today and switch his statin from pravastatin to Lipitor 40 mg since he would benefit from a high-intensity statin.  Will not start baby ASA at this time due to patient's GI bleed two years ago.  DM (diabetes mellitus) Will conduct a diabetic foot exam at next appointment.  Essential hypertension Will restart spironolactone 25 mg since patient is already on max doses of lisinopril and amlodipine and does not want further diuresis from an increase in his HCTZ dose.  Would like to see patient in 2-3 weeks to reassess his BP and medication regimen.  Patient will continue measuring BP at home.    Lezlie OctaveAmanda Winfrey, M.D. 12/08/2017, 7:35 PM PGY-1, [facility];

## 2017-12-08 NOTE — Patient Instructions (Signed)
It was nice seeing you today [name]!  Today, we switched your cholesterol medication from Pravastatin to Lipitor (Atorvastatin) 40 mg.  Please take Lipitor once per day.  We will also measure your lipids today.  I will call you if they are abnormal.   We are also restarting spironolactone 25 mg for your blood pressure.  I would like to see you back in 3 weeks to check your blood pressure and to do a diabetic foot exam.  Please keep up the great work on measuring your blood pressure and blood sugar and taking your medications.  If you have any questions or concerns, please feel free to call the clinic.   Be well,  Dr. Frances FurbishWinfrey

## 2017-12-08 NOTE — Assessment & Plan Note (Signed)
Will conduct a diabetic foot exam at next appointment.

## 2017-12-09 LAB — LIPID PANEL
Chol/HDL Ratio: 3 ratio (ref 0.0–5.0)
Cholesterol, Total: 179 mg/dL (ref 100–199)
HDL: 60 mg/dL (ref >39)
LDL Calculated: 98 mg/dL (ref 0–99)
Triglycerides: 104 mg/dL (ref 0–149)
VLDL Cholesterol Cal: 21 mg/dL (ref 5–40)

## 2017-12-31 ENCOUNTER — Ambulatory Visit: Payer: 59 | Admitting: Family Medicine

## 2018-02-23 ENCOUNTER — Other Ambulatory Visit: Payer: Self-pay | Admitting: Family Medicine

## 2018-02-23 DIAGNOSIS — I10 Essential (primary) hypertension: Secondary | ICD-10-CM

## 2018-04-22 ENCOUNTER — Other Ambulatory Visit: Payer: Self-pay

## 2018-04-23 MED ORDER — TRAMADOL HCL 50 MG PO TABS: 50.0000 mg | ORAL_TABLET | Freq: Three times a day (TID) | ORAL | 0 refills | Status: DC | PRN

## 2018-04-23 MED ORDER — ATORVASTATIN CALCIUM 40 MG PO TABS: 40.0000 mg | ORAL_TABLET | Freq: Every day | ORAL | 11 refills | Status: DC

## 2018-04-23 MED ORDER — METFORMIN HCL 1000 MG PO TABS: 1000.0000 mg | ORAL_TABLET | Freq: Two times a day (BID) | ORAL | 3 refills | Status: DC

## 2018-04-25 ENCOUNTER — Other Ambulatory Visit: Payer: Self-pay

## 2018-04-25 DIAGNOSIS — I10 Essential (primary) hypertension: Secondary | ICD-10-CM

## 2018-04-25 MED ORDER — AMLODIPINE BESYLATE 10 MG PO TABS: 10.0000 mg | ORAL_TABLET | Freq: Every day | ORAL | 1 refills | Status: DC

## 2018-04-25 MED ORDER — TRAMADOL HCL 50 MG PO TABS: 50.0000 mg | ORAL_TABLET | Freq: Three times a day (TID) | ORAL | 0 refills | Status: DC | PRN

## 2018-04-25 NOTE — Telephone Encounter (Signed)
Patient called for refill Amlodipine. Also has not received Tramadol refill. Tramadol was approved on 04/22/18 but was not set to normal. Please resend.  Ples SpecterAlisa Brake, RN [facility] RN)

## 2018-05-12 ENCOUNTER — Ambulatory Visit: Payer: 59 | Admitting: Family Medicine

## 2018-05-17 ENCOUNTER — Ambulatory Visit: Payer: 59 | Admitting: Family Medicine

## 2018-06-24 ENCOUNTER — Ambulatory Visit (INDEPENDENT_AMBULATORY_CARE_PROVIDER_SITE_OTHER): Payer: 59 | Admitting: Family Medicine

## 2018-06-24 ENCOUNTER — Ambulatory Visit (HOSPITAL_COMMUNITY)
Admission: RE | Admit: 2018-06-24 | Discharge: 2018-06-24 | Disposition: A | Discharge status: Home / Self Care | Payer: 59 | Source: Ambulatory Visit | Attending: Internal Medicine | Admitting: Internal Medicine

## 2018-06-24 ENCOUNTER — Other Ambulatory Visit: Payer: Self-pay

## 2018-06-24 ENCOUNTER — Encounter: Payer: Self-pay | Admitting: Family Medicine

## 2018-06-24 VITALS — BP 168/68 | HR 53 | Temp 98.4°F | Ht 72.0 in | Wt 311.4 lb

## 2018-06-24 DIAGNOSIS — M21611 Bunion of right foot: Secondary | ICD-10-CM | POA: Diagnosis not present

## 2018-06-24 DIAGNOSIS — L602 Onychogryphosis: Secondary | ICD-10-CM | POA: Insufficient documentation

## 2018-06-24 DIAGNOSIS — E118 Type 2 diabetes mellitus with unspecified complications: Secondary | ICD-10-CM

## 2018-06-24 DIAGNOSIS — E669 Obesity, unspecified: Secondary | ICD-10-CM | POA: Diagnosis not present

## 2018-06-24 DIAGNOSIS — I10 Essential (primary) hypertension: Secondary | ICD-10-CM

## 2018-06-24 DIAGNOSIS — I5032 Chronic diastolic (congestive) heart failure: Secondary | ICD-10-CM | POA: Insufficient documentation

## 2018-06-24 DIAGNOSIS — Z6841 Body Mass Index (BMI) 40.0 and over, adult: Secondary | ICD-10-CM | POA: Diagnosis not present

## 2018-06-24 DIAGNOSIS — M109 Gout, unspecified: Secondary | ICD-10-CM | POA: Insufficient documentation

## 2018-06-24 DIAGNOSIS — I110 Hypertensive heart disease with heart failure: Secondary | ICD-10-CM | POA: Insufficient documentation

## 2018-06-24 DIAGNOSIS — R001 Bradycardia, unspecified: Secondary | ICD-10-CM | POA: Insufficient documentation

## 2018-06-24 DIAGNOSIS — Z7984 Long term (current) use of oral hypoglycemic drugs: Secondary | ICD-10-CM | POA: Insufficient documentation

## 2018-06-24 DIAGNOSIS — F17210 Nicotine dependence, cigarettes, uncomplicated: Secondary | ICD-10-CM | POA: Insufficient documentation

## 2018-06-24 HISTORY — DX: Bradycardia, unspecified: R00.1

## 2018-06-24 LAB — POCT GLYCOSYLATED HEMOGLOBIN (HGB A1C): HbA1c, POC (controlled diabetic range): 6.6 % (ref 0.0–7.0)

## 2018-06-24 MED ORDER — HYDRALAZINE HCL 10 MG PO TABS: 10.0000 mg | ORAL_TABLET | Freq: Three times a day (TID) | ORAL | 11 refills | Status: DC

## 2018-06-24 MED ORDER — LISINOPRIL 40 MG PO TABS: ORAL_TABLET | ORAL | 3 refills | Status: DC

## 2018-06-24 MED ORDER — ATORVASTATIN CALCIUM 40 MG PO TABS: 40.0000 mg | ORAL_TABLET | Freq: Every day | ORAL | 11 refills | Status: DC

## 2018-06-24 NOTE — Assessment & Plan Note (Signed)
A1c continues to be well controlled at 6.6 today.  We will continue metformin 1000 mg twice daily.

## 2018-06-24 NOTE — Assessment & Plan Note (Signed)
This is likely the cause of patient's presyncope.  Discontinued carvedilol today.  Obtained EKG, which showed significant bradycardia with a rate of 43 bpm, but no AV block or other significantly concerning findings.  Will order an echocardiogram and refer to cardiology for further work-up.

## 2018-06-24 NOTE — Assessment & Plan Note (Signed)
A result of patient's inability to reach his toenails to clip them, also may have an element of fungal infection.  Will refer to podiatry for evaluation and management of his toenails as well as his bunion on his right foot.

## 2018-06-24 NOTE — Assessment & Plan Note (Addendum)
Patient continues to be significantly hypertensive despite good adherence to a variety of blood pressure medications.  Since we are stopping patient's carvedilol due to his bradycardia today, we will start hydralazine 10 mg 3 times daily.  We will check a BMP today since patient is taking HCTZ, lisinopril, spironolactone, and Lasix.  I would like to see patient back within the month to recheck his blood pressure.

## 2018-06-24 NOTE — Patient Instructions (Addendum)
It was nice seeing you today [name]!  Today, we are starting a new medication, called hydralazine, to help with your blood pressure.  Please take this three times per day.  We are stopping your carvedilol, since this can lower your heart rate.  We are getting an EKG today to check your heart rhythm.  We are getting a blood test to measure your electrolytes and another blood test to measure how your heart is pumping.  I am referring you to cardiology for follow-up of your heart rate and your diastolic heart failure (your hypertension has made your heart more stiff and it does not pump as well).  I am also ordering an ultrasound of your heart called an echocardiogram that we will look at how your heart is pumping.  I am also referring you to a podiatrist so that they can take a look at your toenails and the bunion on your right foot.  Please continue doing a great job taking your medications and I would like to see back in about 1 month.  If you have any questions or concerns, please feel free to call the clinic.   Be well,  Dr. Frances Furbish

## 2018-06-24 NOTE — Progress Notes (Addendum)
Subjective:    Seth Sanders - 67 y.o. male MRN [id number]  Date of birth: [birth year]  HPI  Seth Sanders is here for presyncopal episodes, diabetes management, and hypertension management.    Presyncope He has felt lightheaded 3 different times recently, including when he has been sitting and when he is standing.  He does not believe that these are positional.  He has not had syncope.  He denies experiencing a spinning sensation.  Nothing seems to make them better or worse.  He has taken all of his medications as prescribed.  Type 2 diabetes mellitus Continues to take metformin 1000 mg twice daily.  He is trying to watch his intake of sugary foods and white rice and white bread.  Hypertension Has measured his blood pressure daily at home, and is getting measurements with systolic around 140-150 and diastolic 70-80.  Continues to take amlodipine 10 mg daily, Coreg 6.25 mg twice daily, lisinopril 40 mg daily, spironolactone 25 mg daily, and HCTZ 25 mg daily as prescribed.  His blood pressure cuff also measures his heart rate, and he has measurements ranging from 40 bpm to 70 bpm.   Health Maintenance:  Health Maintenance Due  Topic Date Due  . TETANUS/TDAP  05/11/1970  . COLONOSCOPY  05/11/2001  . OPHTHALMOLOGY EXAM  01/19/2014    -  reports that he has been smoking cigarettes. He has a 17.50 pack-year smoking history. He has never used smokeless tobacco. - Review of Systems: Per HPI. - Past Medical History: Patient Active Problem List   Diagnosis Date Noted  . Symptomatic bradycardia 06/24/2018  . Overgrown toenails 06/24/2018  . At risk for obstructive sleep apnea 11/08/2017  . History of anemia 11/08/2017  . Tobacco use disorder 11/08/2017  . Healthcare maintenance 09/29/2016  . History of GI bleed 08/01/2014  . Systolic murmur 03/22/2014  . Low back pain 03/22/2014  . Gout 01/26/2013  . Severe obesity (BMI >= 40) (HCC) 09/08/2012  . Type 2 diabetes mellitus with  complication, without long-term current use of insulin (HCC) 09/08/2012  . Hyperlipidemia 09/08/2012  . Diastolic CHF (HCC) 09/08/2012  . Essential hypertension 04/10/2009   - Medications: reviewed and updated   Objective:   Physical Exam BP (!) 168/68   Pulse (!) 53   Temp 98.4 F (36.9 C) (Oral)   Ht 6' (1.829 m)   Wt (!) 311 lb 6.4 oz (141.3 kg)   SpO2 96%   BMI 42.23 kg/m  Gen: NAD, alert, cooperative with exam, well-appearing, morbidly obese CV: Regular rate, significant bradycardia, good S1/S2, no murmur, no edema Resp: CTABL, no wheezes, non-labored Abd: SNTND, BS present, no guarding or organomegaly Skin: no rashes, normal turgor  Neuro: no gross deficits.  Psych: good insight, alert and oriented  Diabetic Foot Exam - Simple   Simple Foot Form Diabetic Foot exam was performed with the following findings:  Yes 06/24/2018  4:14 PM  Visual Inspection No deformities, no ulcerations, no other skin breakdown bilaterally:  Yes Sensation Testing Intact to touch and monofilament testing bilaterally:  Yes Pulse Check Posterior Tibialis and Dorsalis pulse intact bilaterally:  Yes Comments Dysmorphic toenails on bilateral feet.  Bunion on great right toe.          Assessment & Plan:   Type 2 diabetes mellitus with complication, without long-term current use of insulin (HCC) A1c continues to be well controlled at 6.6 today.  We will continue metformin 1000 mg twice daily.  Symptomatic bradycardia This is  likely the cause of patient's presyncope.  Discontinued carvedilol today.  Obtained EKG, which showed significant bradycardia with a rate of 43 bpm, but no AV block or other significantly concerning findings.  Will order an echocardiogram and refer to cardiology for further work-up.  Essential hypertension Patient continues to be significantly hypertensive despite good adherence to a variety of blood pressure medications.  Since we are stopping patient's carvedilol due  to his bradycardia today, we will start hydralazine 10 mg 3 times daily.  We will check a BMP today since patient is taking HCTZ, lisinopril, spironolactone, and Lasix.  I would like to see patient back within the month to recheck his blood pressure.  Overgrown toenails A result of patient's inability to reach his toenails to clip them, also may have an element of fungal infection.  Will refer to podiatry for evaluation and management of his toenails as well as his bunion on his right foot.    Diastolic CHF Concerned that diastolic CHF is progressing given symptomatic bradycardia and continued uncontrolled hypertension.  Will obtain BMP today, order an echo, and refer to cardiology.    Lezlie Octave, M.D. 06/25/2018, 4:36 PM PGY-2, [facility]

## 2018-06-25 LAB — BASIC METABOLIC PANEL
BUN/Creatinine Ratio: 16 (ref 10–24)
BUN: 14 mg/dL (ref 8–27)
CO2: 26 mmol/L (ref 20–29)
Calcium: 10 mg/dL (ref 8.6–10.2)
Chloride: 100 mmol/L (ref 96–106)
Creatinine, Ser: 0.9 mg/dL (ref 0.76–1.27)
GFR calc Af Amer: 102 mL/min/{1.73_m2} (ref >59)
GFR calc non Af Amer: 88 mL/min/{1.73_m2} (ref >59)
Glucose: 130 mg/dL — ABNORMAL HIGH (ref 65–99)
Potassium: 4.3 mmol/L (ref 3.5–5.2)
Sodium: 141 mmol/L (ref 134–144)

## 2018-06-25 LAB — BRAIN NATRIURETIC PEPTIDE: BNP: 75.6 pg/mL (ref 0.0–100.0)

## 2018-06-25 NOTE — Assessment & Plan Note (Signed)
Concerned that diastolic CHF is progressing given symptomatic bradycardia and continued uncontrolled hypertension.  Will obtain BMP today, order an echo, and refer to cardiology.

## 2018-06-28 ENCOUNTER — Encounter: Payer: Self-pay | Admitting: Family Medicine

## 2018-07-07 ENCOUNTER — Encounter: Payer: Self-pay | Admitting: Family Medicine

## 2018-07-15 ENCOUNTER — Other Ambulatory Visit: Payer: Self-pay | Admitting: Family Medicine

## 2018-07-15 ENCOUNTER — Telehealth: Payer: Self-pay | Admitting: Family Medicine

## 2018-07-15 MED ORDER — ALLOPURINOL 300 MG PO TABS: 150.0000 mg | ORAL_TABLET | Freq: Every day | ORAL | 3 refills | Status: DC

## 2018-07-15 MED ORDER — PREDNISONE 50 MG PO TABS: ORAL_TABLET | ORAL | 0 refills | Status: DC

## 2018-07-15 NOTE — Telephone Encounter (Signed)
Pt is having a bad gout flare up and would like to know if Dr. Frances Furbish could send in a refill for his Allopurinol. He only has one pill left. I told him it can take up to 48 hours but he asked if possible could he have it before the end of the day. He would like someone to call him at [phone].

## 2018-07-15 NOTE — Telephone Encounter (Signed)
Call patient about his gout.  He says that he has had similar presentations like this with gout in the past.  He says it started in his knee and is now in his right wrist extending into his fingers.  He says that the allopurinol does not seem to help him.  I recommended that he take a prednisone course (50 mg daily x7 days) instead to help with this acute flare, and I increased his dose of allopurinol to help prevent future flares in the future.  I prescribed these medications and instructed patient on their use.  Patient is agreeable to this plan.

## 2018-07-26 ENCOUNTER — Telehealth: Payer: Self-pay

## 2018-07-26 NOTE — Telephone Encounter (Signed)
New message    Just an FYI. We have made several attempts to contact this patient including sending a letter to schedule or reschedule their echocardiogram. We will be removing the patient from the echo WQ.   Thank you 

## 2018-07-31 ENCOUNTER — Other Ambulatory Visit: Payer: Self-pay | Admitting: Family Medicine

## 2018-08-04 ENCOUNTER — Telehealth: Payer: Self-pay

## 2018-08-04 NOTE — Telephone Encounter (Signed)
[name] needs to be seen in clinic so that his hand can be evaluated and the most appropriate treatment prescribed.

## 2018-08-04 NOTE — Telephone Encounter (Signed)
LVM to call office back to inform pt of below and to assist in getting an appointment scheduled.Lamonte SakaiZimmerman Rumple, April D, [place]

## 2018-08-04 NOTE — Telephone Encounter (Signed)
Patient called and said his Prednisone and Allopurinol had helped his hand swelling go down. Still has pain from wrist to thumb and is unable to grip things.  Is there anything else he can do for this?  Call back is [phone]  Ples SpecterAlisa Benedicta Sultan, RN [facility] RN)

## 2018-08-06 NOTE — Progress Notes (Signed)
Cardiology Office Note:    Date:  08/08/2018   ID:  Seth Sanders, DOB [birth year], MRN [id number]  PCP:  [name], MD  Cardiologist:  No primary care provider on file.    Referring MD: [name], MD   Chief Complaint  Patient presents with  . Advice Only    Bradycardia  . Congestive Heart Failure    History of Present Illness:    Seth Sanders is a 67 y.o. male with a hx of hypertension, hyperlipidemia and diastolic HF who is referred for consultation concerning symptomatic bradycardia by Pauline Good, MD.   An EKG was performed in October 2019 and demonstrated profound sinus bradycardia.  On October 4 he was seen by Maia Breslow MD.  Because of significant bradycardia on EKG, carvedilol was discontinued (dose at that time was 6.25 mg twice daily).  According to the patient and the associated records, he had been having some sensations of lightheadedness even when sitting.  He did not have any episodes of syncope.  After medication adjustment, he has felt better.Marland Kitchen  He states his blood pressures always been high and it is never been well controlled.  He does not blame anyone for that.  We did discuss his diet and it is loaded with sodium.  He has never had proper dietary instruction.  We briefly discussed a 2 g sodium diet including limiting canned foods, fast foods, chips, nuts, etc.  States he had a heart murmur as a child.  Has no specific recollection of etiology.  Past Medical History:  Diagnosis Date  . CHF (congestive heart failure) ([place])   . Gout   . Heart murmur   . Hyperlipidemia   . Hypertension     Past Surgical History:  Procedure Laterality Date  . NO PAST SURGERIES      Current Medications: Current Meds  Medication Sig  . ACCU-CHEK AVIVA PLUS test strip CHECK BLOOD SUGAR TWICE DAILY  . allopurinol (ZYLOPRIM) 300 MG tablet Take 0.5 tablets (150 mg total) by mouth daily.  Marland Kitchen amLODipine (NORVASC) 10 MG tablet Take 1 tablet (10 mg total) by  mouth daily.  Marland Kitchen atorvastatin (LIPITOR) 40 MG tablet Take 1 tablet (40 mg total) by mouth daily.  . Blood Glucose Monitoring Suppl (ACCU-CHEK AVIVA PLUS) w/Device KIT To check blood sugars twice daily.  . furosemide (LASIX) 20 MG tablet Take 1 tablet (20 mg total) by mouth 2 (two) times daily as needed.  . gabapentin (NEURONTIN) 300 MG capsule Take 1 capsule (300 mg total) by mouth 3 (three) times daily as needed.  . hydrALAZINE (APRESOLINE) 10 MG tablet Take 1 tablet (10 mg total) by mouth 3 (three) times daily.  . hydrochlorothiazide (HYDRODIURIL) 12.5 MG tablet TAKE 1 TABLET(12.5 MG) BY MOUTH DAILY  . lisinopril (PRINIVIL,ZESTRIL) 40 MG tablet TAKE ONE TABLET BY MOUTH ONCE DAILY IN THE MORNING  . metFORMIN (GLUCOPHAGE) 1000 MG tablet Take 1 tablet (1,000 mg total) by mouth 2 (two) times daily with a meal.  . spironolactone (ALDACTONE) 25 MG tablet Take 1 tablet (25 mg total) by mouth daily.  . traMADol (ULTRAM) 50 MG tablet Take 1 tablet (50 mg total) by mouth every 8 (eight) hours as needed.     Allergies:   Patient has no known allergies.   Social History   Socioeconomic History  . Marital status: Widowed    Spouse name: Not on file  . Number of children: Not on file  . Years of education: Not  on file  . Highest education level: Not on file  Occupational History    Employer: [employer]  Social Needs  . Financial resource strain: Not on file  . Food insecurity:    Worry: Not on file    Inability: Not on file  . Transportation needs:    Medical: Not on file    Non-medical: Not on file  Tobacco Use  . Smoking status: Current Every Day Smoker    Packs/day: 0.50    Years: 35.00    Pack years: 17.50    Types: Cigarettes  . Smokeless tobacco: Never Used  . Tobacco comment:  Not interested now in quitting/ls  Substance and Sexual Activity  . Alcohol use: No  . Drug use: No  . Sexual activity: Not on file  Lifestyle  . Physical activity:    Days per week: Not on file     Minutes per session: Not on file  . Stress: Not on file  Relationships  . Social connections:    Talks on phone: Not on file    Gets together: Not on file    Attends religious service: Not on file    Active member of club or organization: Not on file    Attends meetings of clubs or organizations: Not on file    Relationship status: Not on file  Other Topics Concern  . Not on file  Social History Narrative   Works in a nursing home.     Family History: The patient's family history includes Heart attack in his mother; Hypertension in his mother.  ROS:   Please see the history of present illness.    Awakens multiple times at night.  Does not sleep as well as he would like to.  Does not know if he snores because he lives alone.  Smokes cigarettes.  All other systems reviewed and are negative.  EKGs/Labs/Other Studies Reviewed:    The following studies were reviewed today: TSH: 1.36 February 2019.  2D Doppler echocardiogram 2013: Study Conclusions  - Left ventricle: The cavity size was mildly dilated. There was mild concentric hypertrophy. Systolic function was normal. The estimated ejection fraction was in the range of 55% to 60%. Wall motion was normal; there were no regional wall motion abnormalities. Features are consistent with a pseudonormal left ventricular filling pattern, with concomitant abnormal relaxation and increased filling pressure (grade 2 diastolic dysfunction). Doppler parameters are consistent with elevated mean left atrial filling pressure. - Aortic valve: There was very mild stenosis. Mild regurgitation. Valve area: 3.9cm^2(VTI). Valve area: 4.46cm^2 (Vmax). - Left atrium: The atrium was mildly dilated.  EKG:  EKG is at ordered today.  The ekg performed June 24, 2018 revealed severe sinus bradycardia at 43 bpm with prominent voltage and ST-T wave change compatible with left ventricular hypertrophy.  Recent Labs: 11/08/2017:  Hemoglobin 13.7; Platelets 192; TSH 1.360 06/24/2018: BNP 75.6; BUN 14; Creatinine, Ser 0.90; Potassium 4.3; Sodium 141  Recent Lipid Panel    Component Value Date/Time   CHOL 179 12/08/2017 1520   TRIG 104 12/08/2017 1520   HDL 60 12/08/2017 1520   CHOLHDL 3.0 12/08/2017 1520   CHOLHDL 2.5 03/30/2014 0834   VLDL 11 03/30/2014 0834   LDLCALC 98 12/08/2017 1520    Physical Exam:    VS:  BP (!) 162/78   Pulse 70   Ht 6' (1.829 m)   Wt (!) 302 lb 12.8 oz (137.3 kg)   SpO2 96%   BMI 41.07 kg/m  Wt Readings from Last 3 Encounters:  08/08/18 (!) 302 lb 12.8 oz (137.3 kg)  06/24/18 (!) 311 lb 6.4 oz (141.3 kg)  12/08/17 (!) 312 lb 3.2 oz (141.6 kg)     GEN: Morbid.  Obesity.  Well nourished, well developed in no acute distress HEENT: Normal NECK: No JVD. LYMPHATICS: No lymphadenopathy CARDIAC: IRR, scratchy 1-2 over 6 left parasternal systolic murmur, S4 gallop, no edema. VASCULAR: Bounding radial, carotid, and posterior tibial bilateral pulses.  No bruits. RESPIRATORY:  Clear to auscultation without rales, wheezing or rhonchi  ABDOMEN: Soft, non-tender, non-distended, No pulsatile mass, MUSCULOSKELETAL: No deformity  SKIN: Warm and dry NEUROLOGIC:  Alert and oriented x 3 PSYCHIATRIC:  Normal affect   ASSESSMENT:    1. Symptomatic bradycardia   2. Essential hypertension   3. Systolic murmur   4. Chronic diastolic congestive heart failure ([place])   5. Mixed hyperlipidemia   6. At risk for obstructive sleep apnea   7. Severe obesity (BMI >= 40) ([facility])    PLAN:    In order of problems listed above:  1. Heart rate today is 70.  EKG was not repeated.  Beta-blocker therapy was holding heart rate down.  Resolution after discontinuation of carvedilol.  24 to 48-hour Holter monitor will be recorded to establish baseline heart rate.  Clearly on low-dose carvedilol heart rate should not of been 40.  Likely has some intrinsic sinus node dysfunction or AV conduction disease.  The  EKG on beta-blocker therapy reveals severe sinus bradycardia. 2. Not well controlled.  We discussed target blood pressure 130/80 mmHg.  He is relatively inactive, has no restrictions on sodium in his diet, has significant obesity, and may have sleep apnea.  These may be all contributing.  I do believe he takes his medications.  May need a stronger diuretic regimen if salt restriction and weight loss did not help. 3. History of heart murmur as a child.  Soft scratchy systolic murmur.  2D Doppler echocardiogram is ordered to assess murmur, assess for evidence of LVH, and to help further manage underlying cardiovascular state. 4. No evidence of volume overload/heart failure. 5. LDL target should be less than 100 and preferably 70 given his diabetes.  Overall education and awareness concerning primary/secondary risk prevention was discussed in detail: LDL less than 70, hemoglobin A1c less than 7, blood pressure target less than 130/80 mmHg, >150 minutes of moderate aerobic activity per week, avoidance of smoking, weight control (via diet and exercise), and continued surveillance/management of/for obstructive sleep apnea.  Will return for follow-up after 48-hour Holter, echocardiogram, and attempted salt restriction.  Further medication adjustment may be necessary based upon database.  May need to have a sleep study.  After evaluation return to Dr. Milta Deiters with recommendations.  Greater than 50% of the time during this office visit was spent in education, counseling, and coordination of care related to underlying disease process and testing as outlined.   Medication Adjustments/Labs and Tests Ordered: Current medicines are reviewed at length with the patient today.  Concerns regarding medicines are outlined above.  Orders Placed This Encounter  Procedures  . HOLTER MONITOR - 48 HOUR  . ECHOCARDIOGRAM COMPLETE   No orders of the defined types were placed in this encounter.   Patient Instructions    Medication Instructions:  No changes If you need a refill on your cardiac medications before your next appointment, please call your pharmacy.   Lab work: none If you have labs (blood work) drawn today and your  tests are completely normal, you will receive your results only by: Marland Kitchen MyChart Message (if you have MyChart) OR . A paper copy in the mail If you have any lab test that is abnormal or we need to change your treatment, we will call you to review the results.  Testing/Procedures: Your physician has recommended that you wear a holter monitor. Holter monitors are medical devices that record the heart's electrical activity. Doctors most often use these monitors to diagnose arrhythmias. Arrhythmias are problems with the speed or rhythm of the heartbeat. The monitor is a small, portable device. You can wear one while you do your normal daily activities. This is usually used to diagnose what is causing palpitations/syncope (passing out).  Your physician has requested that you have an echocardiogram. Echocardiography is a painless test that uses sound waves to create images of your heart. It provides your doctor with information about the size and shape of your heart and how well your heart's chambers and valves are working. This procedure takes approximately one hour. There are no restrictions for this procedure.   Follow-Up: You have been scheduled for follow up with Dr. Tamala Julian on Monday, October 24, 2018.        Signed, Sinclair Grooms, MD  08/08/2018 10:40 AM    [place]

## 2018-08-08 ENCOUNTER — Encounter: Payer: Self-pay | Admitting: Interventional Cardiology

## 2018-08-08 ENCOUNTER — Ambulatory Visit (INDEPENDENT_AMBULATORY_CARE_PROVIDER_SITE_OTHER): Payer: 59 | Admitting: Interventional Cardiology

## 2018-08-08 VITALS — BP 162/78 | HR 70 | Ht 72.0 in | Wt 302.8 lb

## 2018-08-08 DIAGNOSIS — R001 Bradycardia, unspecified: Secondary | ICD-10-CM

## 2018-08-08 DIAGNOSIS — I10 Essential (primary) hypertension: Secondary | ICD-10-CM

## 2018-08-08 DIAGNOSIS — I5032 Chronic diastolic (congestive) heart failure: Secondary | ICD-10-CM

## 2018-08-08 DIAGNOSIS — R011 Cardiac murmur, unspecified: Secondary | ICD-10-CM | POA: Diagnosis not present

## 2018-08-08 DIAGNOSIS — Z9189 Other specified personal risk factors, not elsewhere classified: Secondary | ICD-10-CM

## 2018-08-08 DIAGNOSIS — E782 Mixed hyperlipidemia: Secondary | ICD-10-CM

## 2018-08-08 NOTE — Patient Instructions (Addendum)
Medication Instructions:  No changes If you need a refill on your cardiac medications before your next appointment, please call your pharmacy.   Lab work: none If you have labs (blood work) drawn today and your tests are completely normal, you will receive your results only by: Marland Kitchen. MyChart Message (if you have MyChart) OR . A paper copy in the mail If you have any lab test that is abnormal or we need to change your treatment, we will call you to review the results.  Testing/Procedures: Your physician has recommended that you wear a holter monitor. Holter monitors are medical devices that record the heart's electrical activity. Doctors most often use these monitors to diagnose arrhythmias. Arrhythmias are problems with the speed or rhythm of the heartbeat. The monitor is a small, portable device. You can wear one while you do your normal daily activities. This is usually used to diagnose what is causing palpitations/syncope (passing out).  Your physician has requested that you have an echocardiogram. Echocardiography is a painless test that uses sound waves to create images of your heart. It provides your doctor with information about the size and shape of your heart and how well your heart's chambers and valves are working. This procedure takes approximately one hour. There are no restrictions for this procedure.   Follow-Up: You have been scheduled for follow up with Dr. Katrinka BlazingSmith on Monday, October 24, 2018.

## 2018-08-10 ENCOUNTER — Encounter: Payer: Self-pay | Admitting: Podiatry

## 2018-08-10 ENCOUNTER — Ambulatory Visit (INDEPENDENT_AMBULATORY_CARE_PROVIDER_SITE_OTHER): Payer: 59 | Admitting: Podiatry

## 2018-08-10 VITALS — BP 158/79 | HR 61

## 2018-08-10 DIAGNOSIS — M2142 Flat foot [pes planus] (acquired), left foot: Secondary | ICD-10-CM

## 2018-08-10 DIAGNOSIS — M79674 Pain in right toe(s): Secondary | ICD-10-CM | POA: Diagnosis not present

## 2018-08-10 DIAGNOSIS — M21619 Bunion of unspecified foot: Secondary | ICD-10-CM

## 2018-08-10 DIAGNOSIS — E1142 Type 2 diabetes mellitus with diabetic polyneuropathy: Secondary | ICD-10-CM

## 2018-08-10 DIAGNOSIS — M2141 Flat foot [pes planus] (acquired), right foot: Secondary | ICD-10-CM | POA: Diagnosis not present

## 2018-08-10 DIAGNOSIS — B351 Tinea unguium: Secondary | ICD-10-CM

## 2018-08-10 DIAGNOSIS — M79675 Pain in left toe(s): Secondary | ICD-10-CM | POA: Diagnosis not present

## 2018-08-10 DIAGNOSIS — M2010 Hallux valgus (acquired), unspecified foot: Secondary | ICD-10-CM | POA: Diagnosis not present

## 2018-08-10 NOTE — Patient Instructions (Addendum)
Diabetes and Foot Care Diabetes may cause you to have problems because of poor blood supply (circulation) to your feet and legs. This may cause the skin on your feet to become thinner, break easier, and heal more slowly. Your skin may become dry, and the skin may peel and crack. You may also have nerve damage in your legs and feet causing decreased feeling in them. You may not notice minor injuries to your feet that could lead to infections or more serious problems. Taking care of your feet is one of the most important things you can do for yourself. Follow these instructions at home:  Wear shoes at all times, even in the house. Do not go barefoot. Bare feet are easily injured.  Check your feet daily for blisters, cuts, and redness. If you cannot see the bottom of your feet, use a mirror or ask someone for help.  Wash your feet with warm water (do not use hot water) and mild soap. Then pat your feet and the areas between your toes until they are completely dry. Do not soak your feet as this can dry your skin.  Apply a moisturizing lotion or petroleum jelly (that does not contain alcohol and is unscented) to the skin on your feet and to dry, brittle toenails. Do not apply lotion between your toes.  Trim your toenails straight across. Do not dig under them or around the cuticle. File the edges of your nails with an emery board or nail file.  Do not cut corns or calluses or try to remove them with medicine.  Wear clean socks or stockings every day. Make sure they are not too tight. Do not wear knee-high stockings since they may decrease blood flow to your legs.  Wear shoes that fit properly and have enough cushioning. To break in new shoes, wear them for just a few hours a day. This prevents you from injuring your feet. Always look in your shoes before you put them on to be sure there are no objects inside.  Do not cross your legs. This may decrease the blood flow to your feet.  If you find a  minor scrape, cut, or break in the skin on your feet, keep it and the skin around it clean and dry. These areas may be cleansed with mild soap and water. Do not cleanse the area with peroxide, alcohol, or iodine.  When you remove an adhesive bandage, be sure not to damage the skin around it.  If you have a wound, look at it several times a day to make sure it is healing.  Do not use heating pads or hot water bottles. They may burn your skin. If you have lost feeling in your feet or legs, you may not know it is happening until it is too late.  Make sure your health care provider performs a complete foot exam at least annually or more often if you have foot problems. Report any cuts, sores, or bruises to your health care provider immediately. Contact a health care provider if:  You have an injury that is not healing.  You have cuts or breaks in the skin.  You have an ingrown nail.  You notice redness on your legs or feet.  You feel burning or tingling in your legs or feet.  You have pain or cramps in your legs and feet.  Your legs or feet are numb.  Your feet always feel cold. Get help right away if:  There is increasing   redness, swelling, or pain in or around a wound.  There is a red line that goes up your leg.  Pus is coming from a wound.  You develop a fever or as directed by your health care provider.  You notice a bad smell coming from an ulcer or wound. This information is not intended to replace advice given to you by your health care provider. Make sure you discuss any questions you have with your health care provider. Document Released: 09/04/2000 Document Revised: 02/13/2016 Document Reviewed: 02/14/2013 Elsevier Interactive Patient Education  2017 [employer] Onychomycosis/Fungal Toenails  WHAT IS IT? An infection that lies within the keratin of your nail plate that is caused by a fungus.  WHY ME? Fungal infections affect all ages, sexes, races, and creeds.   There may be many factors that predispose you to a fungal infection such as age, coexisting medical conditions such as diabetes, or an autoimmune disease; stress, medications, fatigue, genetics, etc.  Bottom line: fungus thrives in a warm, moist environment and your shoes offer such a location.  IS IT CONTAGIOUS? Theoretically, yes.  You do not want to share shoes, nail clippers or files with someone who has fungal toenails.  Walking around barefoot in the same room or sleeping in the same bed is unlikely to transfer the organism.  It is important to realize, however, that fungus can spread easily from one nail to the next on the same foot.  HOW DO WE TREAT THIS?  There are several ways to treat this condition.  Treatment may depend on many factors such as age, medications, pregnancy, liver and kidney conditions, etc.  It is best to ask your doctor which options are available to you.  1. No treatment.   Unlike many other medical concerns, you can live with this condition.  However for many people this can be a painful condition and may lead to ingrown toenails or a bacterial infection.  It is recommended that you keep the nails cut short to help reduce the amount of fungal nail. 2. Topical treatment.  These range from herbal remedies to prescription strength nail lacquers.  About 40-50% effective, topicals require twice daily application for approximately 9 to 12 months or until an entirely new nail has grown out.  The most effective topicals are medical grade medications available through physicians offices. 3. Oral antifungal medications.  With an 80-90% cure rate, the most common oral medication requires 3 to 4 months of therapy and stays in your system for a year as the new nail grows out.  Oral antifungal medications do require blood work to make sure it is a safe drug for you.  A liver function panel will be performed prior to starting the medication and after the first month of treatment.  It is  important to have the blood work performed to avoid any harmful side effects.  In general, this medication safe but blood work is required. 4. Laser Therapy.  This treatment is performed by applying a specialized laser to the affected nail plate.  This therapy is noninvasive, fast, and non-painful.  It is not covered by insurance and is therefore, out of pocket.  The results have been very good with a 80-95% cure rate.  [facility] is the only practice in the area to offer this therapy. 5. Permanent Nail Avulsion.  Removing the entire nail so that a new nail will not grow back. 

## 2018-08-10 NOTE — Progress Notes (Signed)
Subjective: Seth Sanders presents referred by his PCP, Dr. Maia Breslow.  He presents today with history of neuropathy with cc of painful, mycotic toenails.  Duration long-standing.  Location toenails 1 through 5 bilaterally.  Onset was gradual.  Course is worsening.  Aggravating factors include shoe gear.   Patient has peripheral neuropathy managed with gabapentin. Past Medical History:  Diagnosis Date  . CHF (congestive heart failure) ([place])   . Gout   . Heart murmur   . Hyperlipidemia   . Hypertension    Patient Active Problem List   Diagnosis Date Noted  . Symptomatic bradycardia 06/24/2018  . Overgrown toenails 06/24/2018  . At risk for obstructive sleep apnea 11/08/2017  . History of anemia 11/08/2017  . Tobacco use disorder 11/08/2017  . Healthcare maintenance 09/29/2016  . History of GI bleed 08/01/2014  . Systolic murmur 41/66/0630  . Low back pain 03/22/2014  . Gout 01/26/2013  . Severe obesity (BMI >= 40) ([place]) 09/08/2012  . Type 2 diabetes mellitus with complication, without long-term current use of insulin ([place]) 09/08/2012  . Hyperlipidemia 09/08/2012  . Diastolic CHF ([place]) 16/09/930  . Essential hypertension 04/10/2009   Past Surgical History:  Procedure Laterality Date  . NO PAST SURGERIES      Current Outpatient Medications:  .  ACCU-CHEK AVIVA PLUS test strip, CHECK BLOOD SUGAR TWICE DAILY, Disp: 100 each, Rfl: 3 .  allopurinol (ZYLOPRIM) 300 MG tablet, Take 0.5 tablets (150 mg total) by mouth daily., Disp: 90 tablet, Rfl: 3 .  amLODipine (NORVASC) 10 MG tablet, Take 1 tablet (10 mg total) by mouth daily., Disp: 90 tablet, Rfl: 1 .  atorvastatin (LIPITOR) 40 MG tablet, Take 1 tablet (40 mg total) by mouth daily., Disp: 30 tablet, Rfl: 11 .  Blood Glucose Monitoring Suppl (ACCU-CHEK AVIVA PLUS) w/Device KIT, To check blood sugars twice daily., Disp: 1 kit, Rfl: 0 .  furosemide (LASIX) 20 MG tablet, Take 1 tablet (20 mg total) by mouth 2 (two) times daily as  needed., Disp: 180 tablet, Rfl: 3 .  gabapentin (NEURONTIN) 300 MG capsule, Take 1 capsule (300 mg total) by mouth 3 (three) times daily as needed., Disp: 90 capsule, Rfl: 1 .  hydrALAZINE (APRESOLINE) 10 MG tablet, Take 1 tablet (10 mg total) by mouth 3 (three) times daily., Disp: 90 tablet, Rfl: 11 .  hydrochlorothiazide (HYDRODIURIL) 12.5 MG tablet, TAKE 1 TABLET(12.5 MG) BY MOUTH DAILY, Disp: 90 tablet, Rfl: 3 .  lisinopril (PRINIVIL,ZESTRIL) 40 MG tablet, TAKE ONE TABLET BY MOUTH ONCE DAILY IN THE MORNING, Disp: 90 tablet, Rfl: 3 .  metFORMIN (GLUCOPHAGE) 1000 MG tablet, Take 1 tablet (1,000 mg total) by mouth 2 (two) times daily with a meal., Disp: 90 tablet, Rfl: 3 .  spironolactone (ALDACTONE) 25 MG tablet, Take 1 tablet (25 mg total) by mouth daily., Disp: 30 tablet, Rfl: 11 .  traMADol (ULTRAM) 50 MG tablet, Take 1 tablet (50 mg total) by mouth every 8 (eight) hours as needed., Disp: 30 tablet, Rfl: 0   No Known Allergies   Social History   Tobacco Use  Smoking Status Current Every Day Smoker  . Packs/day: 0.50  . Years: 35.00  . Pack years: 17.50  . Types: Cigarettes  Smokeless Tobacco Never Used  Tobacco Comment    Not interested now in quitting/ls   Family History  Problem Relation Age of Onset  . Heart attack Mother   . Hypertension Mother    Review of systems:  Constitutional: Denies chills, fatigue,  fever, sweats, weight change Communication: Optometrist, sign Ecologist, hand writing, iPad/Android device Eyes: Denies diplopia, glare,  light sensitivity, eyeglasses Ears nose mouth throat: Hard of hearing, deaf, sign language, Denies vertigo,  denies bloody nose, denies rhinitis, denies cold sores, denies snoring Cardiovascular: +HTN, +edema, arrhythmia, pacemaker in place, defibrillator in place, Denies chest pain/tightness, chronic anticoagulation, blood clot Respiratory: Denies difficulty breathing, denies congestion Gastrointestinal: Denies abdominal  pain, denies diarrhea, denies nausea, denies vomiting Genitourinary: Denies nocturia, denies pain on urination, denies blood in urine Musculoskeletal: Uses mobility aid, Denies cramping, stiff joints, painful joints,  Skin: +changes in toenails, denies color change dryness, itchy skin, mole changes, or rash  Neurological: denies numbness, paresthesias, burning in feet, denies fainting, denies seizure, denies change in speech. denies headaches, memory problems/poor historian, cerebral palsy Endocrine: Denies dry mouth, denies flushing, denies heat intolerance, denies cold intolerance, denies excessive thirst, denies polyuria, denies nocturia Hematological: Denies easy bleeding, denies excessive bleeding, denies easy bruising, denies enlarged lymph nodes Allergy/immunological: Denies hives denies frequent infections Psychiatric:  anxiety, depression, mood disorder, suicidal ideations, hallucinations  Objective:  Vascular Examination: Capillary refill time immediate to all 10 digits.  Dorsalis pedis and posterior tibial pulses 2/4 bilaterally.  Digital hair sparse bilaterally.  Skin temperature gradient within normal limits bilaterally.  Trace edema   Dermatological Examination: Skin with normal turgor texture and tone bilaterally  Toenails 1-5 b/l discolored, thick, dystrophic with subungual debris and pain with palpation to nailbeds due to thickness of nails.  Musculoskeletal: Muscle strength 5 out of 5 to all muscle groups bilaterally. Hallux valgus with bunion deformity noted bilaterally Pes planus foot type bilaterally  Neurological: Sensation with 10 gram monofilament diminished bilaterally. Vibratory sensation diminished bilaterally.  Assessment: 1. Painful onychomycosis toenails 1-5 b/l 2. Diabetes with peripheral neuropathy 3. Hallux valgus with bunion deformity bilaterally 4. Pes planus bilaterally  Plan: 1. Discussed diabetic foot care principles on today.  Literature  dispensed to patient. 2. Toenails 1-5 b/l were debrided in length and girth without iatrogenic bleeding. 3. Patient to continue soft, supportive shoe gear 4. Patient to report any pedal injuries to medical professional  5. Follow up 3 months. Patient/POA to call should there be a concern in the interim.

## 2018-08-11 NOTE — Telephone Encounter (Signed)
LVM to call office back to assist him in getting an appointment scheduled. Zimmerman Rumple, Denny Mccree D, CMA  

## 2018-08-11 NOTE — Telephone Encounter (Signed)
Patient returned call and stated his hand is much better and he no longer needs appt.  Ples SpecterAlisa Cristofher Livecchi, RN [facility] RN)

## 2018-08-17 ENCOUNTER — Other Ambulatory Visit (HOSPITAL_COMMUNITY): Payer: 59

## 2018-08-22 ENCOUNTER — Encounter: Payer: Self-pay | Admitting: Interventional Cardiology

## 2018-08-29 ENCOUNTER — Telehealth: Payer: Self-pay

## 2018-09-04 ENCOUNTER — Other Ambulatory Visit: Payer: Self-pay | Admitting: Family Medicine

## 2018-09-06 ENCOUNTER — Telehealth: Payer: Self-pay

## 2018-09-06 NOTE — Telephone Encounter (Signed)
That's correct, thanks Page.  He should be taking hydralazine rather than carvedilol.

## 2018-09-06 NOTE — Telephone Encounter (Signed)
Pt called nurse line stating he was confused about his medications. Pt stated he went to the pharmacy and thought he was picking up Tramadol, however the pharmacy gave him Carvedilol. Pt stated he thought he wasn't suppose to be taking Carvedilol anymore. Per 10/4 OV notes, I informed patient he is not, and he is suppose to be taking Hydralazine 10mg  3x daily instead. I informed patient the tramadol rx has not been approved yet. Apt made for January, as patient was suppose to return in November.   Just clarifying- pt is suppose to be taking Hydralazine 3x daily and no longer Carvedilol?

## 2018-09-29 ENCOUNTER — Encounter: Payer: Self-pay | Admitting: Interventional Cardiology

## 2018-10-06 ENCOUNTER — Encounter: Payer: Self-pay | Admitting: Interventional Cardiology

## 2018-10-07 ENCOUNTER — Ambulatory Visit: Payer: 59 | Admitting: Family Medicine

## 2018-10-11 ENCOUNTER — Other Ambulatory Visit: Payer: Self-pay | Admitting: Family Medicine

## 2018-10-11 DIAGNOSIS — I10 Essential (primary) hypertension: Secondary | ICD-10-CM

## 2018-10-23 NOTE — Progress Notes (Deleted)
Cardiology Office Note:    Date:  10/23/2018   ID:  Seth Sanders, DOB Jan 14, 1951, MRN [id number]  PCP:  Lennox Solders, MD  Cardiologist:  Lesleigh Noe, MD   Referring MD: Lennox Solders, MD   No chief complaint on file.   History of Present Illness:    Seth Sanders is a 68 y.o. male with a hx of hypertension, hyperlipidemia and diastolic HF who is referred for consultation concerning symptomatic bradycardia by Marlowe Kays, MD.   Past Medical History:  Diagnosis Date  . CHF (congestive heart failure) ([facility])   . Gout   . Heart murmur   . Hyperlipidemia   . Hypertension     Past Surgical History:  Procedure Laterality Date  . NO PAST SURGERIES      Current Medications: No outpatient medications have been marked as taking for the 10/24/18 encounter (Appointment) with Lyn Records, MD.     Allergies:   Patient has no known allergies.   Social History   Socioeconomic History  . Marital status: Widowed    Spouse name: Not on file  . Number of children: Not on file  . Years of education: Not on file  . Highest education level: Not on file  Occupational History    Employer: [employer]  Social Needs  . Financial resource strain: Not on file  . Food insecurity:    Worry: Not on file    Inability: Not on file  . Transportation needs:    Medical: Not on file    Non-medical: Not on file  Tobacco Use  . Smoking status: Current Every Day Smoker    Packs/day: 0.50    Years: 35.00    Pack years: 17.50    Types: Cigarettes  . Smokeless tobacco: Never Used  . Tobacco comment:  Not interested now in quitting/ls  Substance and Sexual Activity  . Alcohol use: No  . Drug use: No  . Sexual activity: Not on file  Lifestyle  . Physical activity:    Days per week: Not on file    Minutes per session: Not on file  . Stress: Not on file  Relationships  . Social connections:    Talks on phone: Not on file    Gets together: Not on file    Attends religious  service: Not on file    Active member of club or organization: Not on file    Attends meetings of clubs or organizations: Not on file    Relationship status: Not on file  Other Topics Concern  . Not on file  Social History Narrative   Works in a nursing home.     Family History: The patient's family history includes Heart attack in his mother; Hypertension in his mother.  ROS:   Please see the history of present illness.    *** All other systems reviewed and are negative.  EKGs/Labs/Other Studies Reviewed:    The following studies were reviewed today: ***  EKG:  EKG ***  Recent Labs: 11/08/2017: Hemoglobin 13.7; Platelets 192; TSH 1.360 06/24/2018: BNP 75.6; BUN 14; Creatinine, Ser 0.90; Potassium 4.3; Sodium 141  Recent Lipid Panel    Component Value Date/Time   CHOL 179 12/08/2017 1520   TRIG 104 12/08/2017 1520   HDL 60 12/08/2017 1520   CHOLHDL 3.0 12/08/2017 1520   CHOLHDL 2.5 03/30/2014 0834   VLDL 11 03/30/2014 0834   LDLCALC 98 12/08/2017 1520    Physical Exam:  VS:  There were no vitals taken for this visit.    Wt Readings from Last 3 Encounters:  08/08/18 (!) 302 lb 12.8 oz (137.3 kg)  06/24/18 (!) 311 lb 6.4 oz (141.3 kg)  12/08/17 (!) 312 lb 3.2 oz (141.6 kg)     GEN: ***. No acute distress HEENT: Normal NECK: No JVD. LYMPHATICS: No lymphadenopathy CARDIAC: ***RRR.  *** murmur, ***gallop, ***edema VASCULAR: *** Pulses, *** bruits RESPIRATORY:  Clear to auscultation without rales, wheezing or rhonchi  ABDOMEN: Soft, non-tender, non-distended, No pulsatile mass, MUSCULOSKELETAL: No deformity  SKIN: Warm and dry NEUROLOGIC:  Alert and oriented x 3 PSYCHIATRIC:  Normal affect   ASSESSMENT:    1. Mixed hyperlipidemia   2. Chronic diastolic congestive heart failure (HCC)   3. Essential hypertension   4. Severe obesity (BMI >= 40) (HCC)   5. Symptomatic bradycardia    PLAN:    In order of problems listed above:  1. ***   Medication  Adjustments/Labs and Tests Ordered: Current medicines are reviewed at length with the patient today.  Concerns regarding medicines are outlined above.  No orders of the defined types were placed in this encounter.  No orders of the defined types were placed in this encounter.   There are no Patient Instructions on file for this visit.   Signed, Lesleigh Noe, MD  10/23/2018 4:11 PM    [facility]

## 2018-10-24 ENCOUNTER — Ambulatory Visit: Payer: Medicare Other | Admitting: Interventional Cardiology

## 2018-10-25 ENCOUNTER — Encounter: Payer: Self-pay | Admitting: Interventional Cardiology

## 2018-11-10 ENCOUNTER — Ambulatory Visit: Payer: 59 | Admitting: Podiatry

## 2018-11-22 ENCOUNTER — Other Ambulatory Visit: Payer: Self-pay | Admitting: *Deleted

## 2018-11-22 DIAGNOSIS — I10 Essential (primary) hypertension: Secondary | ICD-10-CM

## 2018-11-23 MED ORDER — SPIRONOLACTONE 25 MG PO TABS: 25.0000 mg | ORAL_TABLET | Freq: Every day | ORAL | 11 refills | Status: DC

## 2018-11-23 MED ORDER — HYDROCHLOROTHIAZIDE 12.5 MG PO TABS: ORAL_TABLET | ORAL | 3 refills | Status: DC

## 2018-11-24 ENCOUNTER — Other Ambulatory Visit: Payer: Self-pay | Admitting: Family Medicine

## 2018-11-24 DIAGNOSIS — I5032 Chronic diastolic (congestive) heart failure: Secondary | ICD-10-CM

## 2019-01-25 ENCOUNTER — Telehealth: Payer: Self-pay | Admitting: Family Medicine

## 2019-01-25 NOTE — Telephone Encounter (Signed)
Pt is calling because he needs a new referral for [facility]. jw

## 2019-01-26 ENCOUNTER — Other Ambulatory Visit: Payer: Self-pay | Admitting: Family Medicine

## 2019-01-26 DIAGNOSIS — L602 Onychogryphosis: Secondary | ICD-10-CM

## 2019-01-26 NOTE — Telephone Encounter (Signed)
LVM asking pt to call, referral has been placed. Sunday Spillers, CMA

## 2019-01-26 NOTE — Telephone Encounter (Signed)
Referral sent. Thanks.

## 2019-02-28 ENCOUNTER — Other Ambulatory Visit: Payer: Self-pay

## 2019-02-28 ENCOUNTER — Encounter: Payer: Self-pay | Admitting: Podiatry

## 2019-02-28 ENCOUNTER — Ambulatory Visit (INDEPENDENT_AMBULATORY_CARE_PROVIDER_SITE_OTHER): Payer: Medicare Other | Admitting: Podiatry

## 2019-02-28 VITALS — Temp 97.5°F

## 2019-02-28 DIAGNOSIS — E1142 Type 2 diabetes mellitus with diabetic polyneuropathy: Secondary | ICD-10-CM | POA: Diagnosis not present

## 2019-02-28 DIAGNOSIS — M79674 Pain in right toe(s): Secondary | ICD-10-CM

## 2019-02-28 DIAGNOSIS — M79675 Pain in left toe(s): Secondary | ICD-10-CM

## 2019-02-28 DIAGNOSIS — B351 Tinea unguium: Secondary | ICD-10-CM | POA: Diagnosis not present

## 2019-02-28 NOTE — Patient Instructions (Signed)
Diabetes Mellitus and Foot Care  Foot care is an important part of your health, especially when you have diabetes. Diabetes may cause you to have problems because of poor blood flow (circulation) to your feet and legs, which can cause your skin to:   Become thinner and drier.   Break more easily.   Heal more slowly.   Peel and crack.  You may also have nerve damage (neuropathy) in your legs and feet, causing decreased feeling in them. This means that you may not notice minor injuries to your feet that could lead to more serious problems. Noticing and addressing any potential problems early is the best way to prevent future foot problems.  How to care for your feet  Foot hygiene   Wash your feet daily with warm water and mild soap. Do not use hot water. Then, pat your feet and the areas between your toes until they are completely dry. Do not soak your feet as this can dry your skin.   Trim your toenails straight across. Do not dig under them or around the cuticle. File the edges of your nails with an emery board or nail file.   Apply a moisturizing lotion or petroleum jelly to the skin on your feet and to dry, brittle toenails. Use lotion that does not contain alcohol and is unscented. Do not apply lotion between your toes.  Shoes and socks   Wear clean socks or stockings every day. Make sure they are not too tight. Do not wear knee-high stockings since they may decrease blood flow to your legs.   Wear shoes that fit properly and have enough cushioning. Always look in your shoes before you put them on to be sure there are no objects inside.   To break in new shoes, wear them for just a few hours a day. This prevents injuries on your feet.  Wounds, scrapes, corns, and calluses   Check your feet daily for blisters, cuts, bruises, sores, and redness. If you cannot see the bottom of your feet, use a mirror or ask someone for help.   Do not cut corns or calluses or try to remove them with medicine.   If you  find a minor scrape, cut, or break in the skin on your feet, keep it and the skin around it clean and dry. You may clean these areas with mild soap and water. Do not clean the area with peroxide, alcohol, or iodine.   If you have a wound, scrape, corn, or callus on your foot, look at it several times a day to make sure it is healing and not infected. Check for:  ? Redness, swelling, or pain.  ? Fluid or blood.  ? Warmth.  ? Pus or a bad smell.  General instructions   Do not cross your legs. This may decrease blood flow to your feet.   Do not use heating pads or hot water bottles on your feet. They may burn your skin. If you have lost feeling in your feet or legs, you may not know this is happening until it is too late.   Protect your feet from hot and cold by wearing shoes, such as at the beach or on hot pavement.   Schedule a complete foot exam at least once a year (annually) or more often if you have foot problems. If you have foot problems, report any cuts, sores, or bruises to your health care provider immediately.  Contact a health care provider if:     You have a medical condition that increases your risk of infection and you have any cuts, sores, or bruises on your feet.   You have an injury that is not healing.   You have redness on your legs or feet.   You feel burning or tingling in your legs or feet.   You have pain or cramps in your legs and feet.   Your legs or feet are numb.   Your feet always feel cold.   You have pain around a toenail.  Get help right away if:   You have a wound, scrape, corn, or callus on your foot and:  ? You have pain, swelling, or redness that gets worse.  ? You have fluid or blood coming from the wound, scrape, corn, or callus.  ? Your wound, scrape, corn, or callus feels warm to the touch.  ? You have pus or a bad smell coming from the wound, scrape, corn, or callus.  ? You have a fever.  ? You have a red line going up your leg.  Summary   Check your feet every day  for cuts, sores, red spots, swelling, and blisters.   Moisturize feet and legs daily.   Wear shoes that fit properly and have enough cushioning.   If you have foot problems, report any cuts, sores, or bruises to your health care provider immediately.   Schedule a complete foot exam at least once a year (annually) or more often if you have foot problems.  This information is not intended to replace advice given to you by your health care provider. Make sure you discuss any questions you have with your health care provider.  Document Released: 09/04/2000 Document Revised: 10/20/2017 Document Reviewed: 10/09/2016  Elsevier Interactive Patient Education  2019 Elsevier Inc[employer]

## 2019-03-08 NOTE — Progress Notes (Signed)
Subjective:  Seth Sanders presents to clinic today with cc of  painful, thick, discolored, elongated toenails 1-5 b/l that become tender and cannot cut because of thickness.  Pain is aggravated when wearing enclosed shoe gear.  Seth Alu, MD    Current Outpatient Medications:  .  ACCU-CHEK AVIVA PLUS test strip, CHECK BLOOD SUGAR TWICE DAILY, Disp: 100 each, Rfl: 3 .  allopurinol (ZYLOPRIM) 300 MG tablet, Take 0.5 tablets (150 mg total) by mouth daily., Disp: 90 tablet, Rfl: 3 .  amLODipine (NORVASC) 10 MG tablet, TAKE 1 TABLET(10 MG) BY MOUTH DAILY, Disp: 90 tablet, Rfl: 1 .  atorvastatin (LIPITOR) 40 MG tablet, Take 1 tablet (40 mg total) by mouth daily., Disp: 30 tablet, Rfl: 11 .  Blood Glucose Monitoring Suppl (ACCU-CHEK AVIVA PLUS) w/Device KIT, To check blood sugars twice daily., Disp: 1 kit, Rfl: 0 .  furosemide (LASIX) 20 MG tablet, TAKE 1 TABLET(20 MG) BY MOUTH TWICE DAILY AS NEEDED, Disp: 180 tablet, Rfl: 3 .  gabapentin (NEURONTIN) 300 MG capsule, Take 1 capsule (300 mg total) by mouth 3 (three) times daily as needed., Disp: 90 capsule, Rfl: 1 .  hydrALAZINE (APRESOLINE) 10 MG tablet, Take 1 tablet (10 mg total) by mouth 3 (three) times daily., Disp: 90 tablet, Rfl: 11 .  hydrochlorothiazide (HYDRODIURIL) 12.5 MG tablet, TAKE 1 TABLET(12.5 MG) BY MOUTH DAILY, Disp: 90 tablet, Rfl: 3 .  lisinopril (PRINIVIL,ZESTRIL) 40 MG tablet, TAKE ONE TABLET BY MOUTH ONCE DAILY IN THE MORNING, Disp: 90 tablet, Rfl: 3 .  metFORMIN (GLUCOPHAGE) 1000 MG tablet, TAKE 1 TABLET(1000 MG) BY MOUTH TWICE DAILY WITH A MEAL, Disp: 90 tablet, Rfl: 3 .  spironolactone (ALDACTONE) 25 MG tablet, Take 1 tablet (25 mg total) by mouth daily., Disp: 30 tablet, Rfl: 11 .  traMADol (ULTRAM) 50 MG tablet, TAKE 1 TABLET(50 MG) BY MOUTH EVERY 8 HOURS AS NEEDED, Disp: 30 tablet, Rfl: 0   No Known Allergies   Objective: Vitals:   02/28/19 0835  Temp: (!) 97.5 F (36.4 C)    Physical  Examination:  Vascular Examination: Capillary refill time immediate x 10 digits.  Palpable DP/PT pulses b/l.  Digital hair sparse  b/l.  No edema noted b/l.  Skin temperature gradient WNL b/l.  Dermatological Examination: Skin with normal turgor, texture and tone b/l.  No open wounds b/l.  No interdigital macerations noted b/l.  Elongated, thick, discolored brittle toenails with subungual debris and pain on dorsal palpation of nailbeds 1-5 b/l.  Musculoskeletal Examination: Muscle strength 5/5 to all muscle groups b/l.  HAV with bunion deformity b/l.  Pes planus foot type b/l.  No pain, crepitus or joint discomfort with active/passive ROM.  Neurological Examination: Sensation diminished b/l with 10 gram monofilament.  Vibratory sensation diminished b/l.  Proprioceptive sensation intact b/l.  Assessment: Mycotic nail infection with pain 1-5 b/l NIDDM with neuropathy  Plan: 1. Continue diabetic foot care principles. Literature dispensed on today. 2. Toenails 1-5 b/l were debrided in length and girth without iatrogenic laceration. 3. Continue soft, supportive shoe gear daily. 4.   Report any pedal injuries to medical professional. 5.   Follow up 3 months. 6.   Patient/POA to call should there be a question/concern in there interim.

## 2019-03-30 ENCOUNTER — Other Ambulatory Visit: Payer: Self-pay | Admitting: Family Medicine

## 2019-03-30 MED ORDER — LISINOPRIL 40 MG PO TABS: ORAL_TABLET | ORAL | 3 refills | Status: DC

## 2019-03-30 NOTE — Telephone Encounter (Signed)
I have refilled the patient's lisinopril.  I don't remember receiving a refill request, but if I missed it, I apologize.

## 2019-03-30 NOTE — Telephone Encounter (Signed)
Pt called to see if he could get refill for his medication lisinopril. Please give pt a call back. Pt states that it has 4 days since he's heard anything about his refill.

## 2019-03-31 ENCOUNTER — Other Ambulatory Visit: Payer: Self-pay

## 2019-03-31 DIAGNOSIS — I10 Essential (primary) hypertension: Secondary | ICD-10-CM

## 2019-03-31 MED ORDER — AMLODIPINE BESYLATE 10 MG PO TABS: ORAL_TABLET | ORAL | 1 refills | Status: DC

## 2019-03-31 NOTE — Telephone Encounter (Signed)
LVM to call office back to inform him of below.Demari Kropp Zimmerman Rumple, CMA  

## 2019-04-04 NOTE — Telephone Encounter (Signed)
LVM for pt to call office back, wanted to inform him that his Rx has been sent.April Zimmerman Rumple, CMA

## 2019-04-10 ENCOUNTER — Other Ambulatory Visit: Payer: Self-pay | Admitting: *Deleted

## 2019-04-11 MED ORDER — METFORMIN HCL 1000 MG PO TABS: ORAL_TABLET | ORAL | 3 refills | Status: DC

## 2019-05-15 ENCOUNTER — Encounter: Payer: Self-pay | Admitting: Family Medicine

## 2019-05-15 ENCOUNTER — Other Ambulatory Visit: Payer: Self-pay

## 2019-05-15 ENCOUNTER — Ambulatory Visit (INDEPENDENT_AMBULATORY_CARE_PROVIDER_SITE_OTHER): Payer: Medicare Other | Admitting: Family Medicine

## 2019-05-15 VITALS — BP 160/68 | HR 75 | Ht 72.0 in | Wt 317.0 lb

## 2019-05-15 DIAGNOSIS — E118 Type 2 diabetes mellitus with unspecified complications: Secondary | ICD-10-CM

## 2019-05-15 DIAGNOSIS — Z0000 Encounter for general adult medical examination without abnormal findings: Secondary | ICD-10-CM

## 2019-05-15 DIAGNOSIS — I10 Essential (primary) hypertension: Secondary | ICD-10-CM | POA: Diagnosis not present

## 2019-05-15 DIAGNOSIS — Z1211 Encounter for screening for malignant neoplasm of colon: Secondary | ICD-10-CM

## 2019-05-15 LAB — POCT GLYCOSYLATED HEMOGLOBIN (HGB A1C): HbA1c, POC (controlled diabetic range): 6.9 % (ref 0.0–7.0)

## 2019-05-15 MED ORDER — TRAMADOL HCL 50 MG PO TABS: 50.0000 mg | ORAL_TABLET | Freq: Two times a day (BID) | ORAL | 0 refills | Status: DC | PRN

## 2019-05-15 MED ORDER — INDAPAMIDE 2.5 MG PO TABS: 2.5000 mg | ORAL_TABLET | Freq: Every day | ORAL | 2 refills | Status: DC

## 2019-05-15 NOTE — Patient Instructions (Signed)
It was nice seeing you today Mr. Wafer!  Today, we are switching your HCTZ to a new medication called indapamide, which she should take once daily.  Please also switch your amlodipine and lisinopril to taking them at night rather than in the morning.  Please call me in 1 week to tell me what your blood pressures have been.  We may need to refer you to our specialized hypertension clinic if this continues to be a problem.  I have refilled your tramadol.  Please see an eye doctor to check your vision and to make sure your diabetes is not damaging her eyes.  Keep working on reducing the amount of bread and pasta you eat.  I am also including a handout on the DASH diet, which will help with your blood pressure.  I have ordered the Cologuard test for you.  Someone will call you in the next few weeks to arrange this.  If you have any questions or concerns, please feel free to call the clinic.   Be well,  Dr. Shan Levans

## 2019-05-15 NOTE — Progress Notes (Signed)
Subjective:    Seth Sanders - 68 y.o. male MRN [id number]  Date of birth: [birth year]  CC:  Seth Sanders is here for follow up of diabetes and hypertension.  HPI: Hypertension Has been recording his blood pressures at home, which show systolic values in the 150s and 160s and diastolic values in the 60s and 70s, although there are some normal pressures as well Denies blurry vision, headaches Diet - more veggies, less fried food Meds - no longer taking hydralazine due to palpitations, but is taking amlodipine 10 mg, HCTZ 12.5 mg, lisinopril 40 mg, spironolactone 25 mg, and furosemide 20 mg daily. Recently went to the dentist for a tooth ache, and they said that they would not do any procedures until his blood pressure was better controlled  Type 2 diabetes Continues to take metformin 1000 mg twice daily Also takes Lipitor 40 mg daily for hyperlipidemia Denies polyuria, polyphagia, and polydipsia Does endorse eating bread and pasta frequently Has not been to an eye doctor in many years Does not want to get the Pneumovax vaccine today   Health Maintenance:  Health Maintenance Due  Topic Date Due  . TETANUS/TDAP  05/11/1970  . COLONOSCOPY  05/11/2001  . OPHTHALMOLOGY EXAM  01/19/2014  . PNA vac Low Risk Adult (2 of 2 - PPSV23) 11/08/2018  . INFLUENZA VACCINE  04/22/2019    -  reports that he has been smoking cigarettes. He has a 17.50 pack-year smoking history. He has never used smokeless tobacco. - Review of Systems: Per HPI. - Past Medical History: Patient Active Problem List   Diagnosis Date Noted  . Symptomatic bradycardia 06/24/2018  . Overgrown toenails 06/24/2018  . At risk for obstructive sleep apnea 11/08/2017  . History of anemia 11/08/2017  . Tobacco use disorder 11/08/2017  . Healthcare maintenance 09/29/2016  . History of GI bleed 08/01/2014  . Systolic murmur 03/22/2014  . Low back pain 03/22/2014  . Gout 01/26/2013  . Severe obesity (BMI >= 40) (HCC)  09/08/2012  . Type 2 diabetes mellitus with complication, without long-term current use of insulin (HCC) 09/08/2012  . Hyperlipidemia 09/08/2012  . Diastolic CHF (HCC) 09/08/2012  . Essential hypertension 04/10/2009   - Medications: reviewed and updated   Objective:   Physical Exam BP (!) 160/68   Pulse 75   Ht 6' (1.829 m)   Wt (!) 317 lb (143.8 kg)   SpO2 97%   BMI 42.99 kg/m  Gen: NAD, alert, cooperative with exam, well-appearing, pleasant, obese CV: RRR, good S1/S2, grade 1 systolic murmur Resp: CTABL, no wheezes, non-labored Psych: good insight, alert and oriented    Assessment & Plan:   Type 2 diabetes mellitus with complication, without long-term current use of insulin (HCC) A1c is 6.9 today, slightly worse than his previous value of 6.6.  Counseled patient that he can continue taking metformin 1000 mg twice daily, but he should work on reducing the amount of carbohydrates such as bread and pasta he is consuming.  Although he reports making dietary changes, his weight continues to increase.  We will continue to monitor about every 3 months.  We will also obtain a lipid panel today.  Would consider referring him to our clinics nutritionist if he is interested at his next visit.  Essential hypertension Continues to have a wide pulse pressure despite being on several medications.  I do trust that patient is adherent to the medications that he reports taking.  We will stop the HCTZ and start  indapamide 2.5 mg once daily.  We will also change the timing of his amlodipine and lisinopril to nightly since this has shown to be beneficial.  He plans to take his blood pressures over the next week and call me to report them.  At that time, if they are still not controlled, I may refer him to our hypertension clinic.  He was given a handout on the DASH diet today.  Healthcare maintenance Cologuard order placed today after reviewing colon screening options with patient.    Maia Breslow,  M.D. 05/16/2019, 8:46 AM PGY-3, [place]

## 2019-05-16 ENCOUNTER — Telehealth: Payer: Self-pay | Admitting: *Deleted

## 2019-05-16 LAB — LIPID PANEL
Chol/HDL Ratio: 2.3 ratio (ref 0.0–5.0)
Cholesterol, Total: 117 mg/dL (ref 100–199)
HDL: 50 mg/dL (ref >39)
LDL Calculated: 53 mg/dL (ref 0–99)
Triglycerides: 70 mg/dL (ref 0–149)
VLDL Cholesterol Cal: 14 mg/dL (ref 5–40)

## 2019-05-16 NOTE — Telephone Encounter (Signed)
2nd VM left instructing pt to call office.  PLease inform him of below if he calls back. April Zimmerman Rumple, CMA

## 2019-05-16 NOTE — Telephone Encounter (Signed)
-----   Message from [name], MD sent at 05/16/2019 11:37 AM EDT ----- These let [name] know that his cholesterol is much better and is completely normal due to the Lipitor.  He should continue taking this medication.  Thanks.

## 2019-05-16 NOTE — Assessment & Plan Note (Signed)
Continues to have a wide pulse pressure despite being on several medications.  I do trust that patient is adherent to the medications that he reports taking.  We will stop the HCTZ and start indapamide 2.5 mg once daily.  We will also change the timing of his amlodipine and lisinopril to nightly since this has shown to be beneficial.  He plans to take his blood pressures over the next week and call me to report them.  At that time, if they are still not controlled, I may refer him to our hypertension clinic.  He was given a handout on the DASH diet today.

## 2019-05-16 NOTE — Assessment & Plan Note (Addendum)
Cologuard order placed today after reviewing colon screening options with patient.

## 2019-05-16 NOTE — Assessment & Plan Note (Addendum)
A1c is 6.9 today, slightly worse than his previous value of 6.6.  Counseled patient that he can continue taking metformin 1000 mg twice daily, but he should work on reducing the amount of carbohydrates such as bread and pasta he is consuming.  Although he reports making dietary changes, his weight continues to increase.  We will continue to monitor about every 3 months.  We will also obtain a lipid panel today.  Would consider referring him to our clinics nutritionist if he is interested at his next visit.

## 2019-05-16 NOTE — Telephone Encounter (Signed)
LVM to call office back to inform pt of below. April Zimmerman Rumple, CMA  

## 2019-05-16 NOTE — Telephone Encounter (Signed)
Please call patient again, to inform him of his results.  [phone].

## 2019-05-17 NOTE — Telephone Encounter (Signed)
Contacted pt and informed him of below and he asked about his medication.  He said he has 2 medications that start with H and he wanted to know if he was supposed to still be taking them.  I told him that it looked like he had stopped taking his hydralazine due to palpitations per the note from last visit and I did not see the HCTZ on his current med list so I told pt that I would send message to PCP to check to see which medications he should be taking. April Zimmerman Rumple, CMA

## 2019-05-18 NOTE — Telephone Encounter (Signed)
Please let Seth Sanders know that he should not be taking any medications that start with "H" because he had already stopped the hydralazine and we stopped his HCTZ and started a new medication called indapamide at his last visit.  Thanks.

## 2019-05-19 NOTE — Telephone Encounter (Signed)
LVM for pt to call office to inform him of below. Adriena Manfre Zimmerman Rumple, CMA  

## 2019-05-19 NOTE — Telephone Encounter (Signed)
Pt informed of below.Seth Sanders, CMA ? ?

## 2019-05-23 ENCOUNTER — Telehealth: Payer: Self-pay

## 2019-05-23 NOTE — Telephone Encounter (Signed)
Patient calls nurse line with 1 week of BP readings. Patient stated he is not sure what his BP meds are, he thinks he takes Lisinopril, Amlodipine, and Spironolactone.   8/25 155/74 am and 162/74 pm 8/26 162/74 am and 144/68 pm 8/27 139/78 am and 163/80 pm 8/28 130/83 am and 144/75 pm  8/29 165/76 am and 144/75 pm 8/30 146/72 am and 146/72 pm  8/31 155/77 am and 143/77 pm  9/1   130/64

## 2019-05-25 ENCOUNTER — Other Ambulatory Visit: Payer: Self-pay | Admitting: Family Medicine

## 2019-05-25 ENCOUNTER — Telehealth: Payer: Self-pay | Admitting: Family Medicine

## 2019-05-25 NOTE — Telephone Encounter (Signed)
Left a voicemail with Seth Sanders letting him know that he should also be taking indapamide in addition to his lisinopril, amlodipine, and spironolactone for his blood pressure.  Advised him to continue checking his blood pressures and to let me know if he has had any problems obtaining or tolerating indapamide.  If he is taking indapamide as well as his other blood pressure medications and his blood pressures continue to be the same as they have been, we may need to add a clonidine patch.

## 2019-06-05 ENCOUNTER — Ambulatory Visit: Payer: Medicare Other | Admitting: Podiatry

## 2019-06-09 NOTE — Telephone Encounter (Signed)
Pt calls to report BP's.  After the 15, the BP machine he was borrowing was returned to owner, so he was unable to take more.   BP Meds: indapamide, lisinopril, amlodipine, and spironolactone   9/10 - 146/70 (am)           153/73 (pm)  9/11 - 135/74 (am)            139/69 (pm)  9/12 - 139/75 (am)            144/74 (pm)  9/13 - 155/69 (am)            147/66 (pm)  9/14 - 160/73 (am)           137/71 (pm)  9/15 - 122/66 (am)           137/77 (pm)  Pt denies CP or SOB  To provider to advised.  Christen Bame, CMA

## 2019-06-09 NOTE — Telephone Encounter (Signed)
Thanks Seth Sanders!  No changes needed for now.

## 2019-06-23 ENCOUNTER — Other Ambulatory Visit: Payer: Self-pay | Admitting: Family Medicine

## 2019-06-23 DIAGNOSIS — Z0000 Encounter for general adult medical examination without abnormal findings: Secondary | ICD-10-CM

## 2019-07-06 ENCOUNTER — Other Ambulatory Visit: Payer: Self-pay

## 2019-07-06 NOTE — Patient Outreach (Signed)
[facility]) Care Management  07/06/2019  [name] 01-Jan-1951 [id number]   Medication Adherence call to [name] HIPPA Compliant Voice message left with a call back number. Seth Sanders is showing past due on Atorvastatin 40 mg under [facility].   Seth Sanders Management Direct Dial [phone]  Fax [phone] [email or web address]

## 2019-07-10 ENCOUNTER — Other Ambulatory Visit: Payer: Self-pay | Admitting: *Deleted

## 2019-07-10 MED ORDER — ATORVASTATIN CALCIUM 40 MG PO TABS: 40.0000 mg | ORAL_TABLET | Freq: Every day | ORAL | 11 refills | Status: DC

## 2019-07-23 ENCOUNTER — Telehealth: Payer: Self-pay | Admitting: Family Medicine

## 2019-07-23 NOTE — Telephone Encounter (Signed)
Would you call Seth Sanders and see if he is having any difficulties returning his Cologuard sample?  I received a notification from the company that they have tried to contact him multiple times regarding this.  Thanks.

## 2019-07-25 NOTE — Telephone Encounter (Signed)
Contacted pt and informed him of below and he said that he has not done it but that he would take care of it as soon as possible.Seth Sanders, CMA

## 2019-08-07 ENCOUNTER — Telehealth: Payer: Self-pay | Admitting: *Deleted

## 2019-08-07 NOTE — Telephone Encounter (Signed)
LM for patient asking him to contact the office for an appointment if he is interested in getting his flu shot .  Jazmin Hartsell,CMA

## 2019-08-08 ENCOUNTER — Other Ambulatory Visit: Payer: Self-pay | Admitting: Family Medicine

## 2019-09-13 NOTE — Telephone Encounter (Signed)
error 

## 2019-10-06 ENCOUNTER — Other Ambulatory Visit: Payer: Self-pay

## 2019-10-06 DIAGNOSIS — I10 Essential (primary) hypertension: Secondary | ICD-10-CM

## 2019-10-06 MED ORDER — METFORMIN HCL 1000 MG PO TABS: ORAL_TABLET | ORAL | 3 refills | Status: DC

## 2019-10-06 MED ORDER — AMLODIPINE BESYLATE 10 MG PO TABS: ORAL_TABLET | ORAL | 1 refills | Status: DC

## 2019-11-07 ENCOUNTER — Other Ambulatory Visit: Payer: Self-pay | Admitting: Family Medicine

## 2019-12-18 ENCOUNTER — Telehealth: Payer: Self-pay | Admitting: *Deleted

## 2019-12-18 ENCOUNTER — Encounter: Payer: Self-pay | Admitting: Family Medicine

## 2019-12-18 NOTE — Telephone Encounter (Signed)
-----   Message from Jennette Bill, CMA sent at 12/18/2019 11:29 AM EDT ----- Regarding: cologuard This patient's cologuard order is set to expire soon. Please let him know he needs to complete this and return it to [facility] soon.  Thanks!

## 2019-12-18 NOTE — Telephone Encounter (Signed)
LVM for pt to call office to inform him of below.  Please let him know that he should get this sent our as soon as possible. April Zimmerman Rumple, CMA

## 2019-12-18 NOTE — Telephone Encounter (Signed)
Mailed a letter informing pt of the below information as well.Latoya Diskin Zimmerman Rumple, CMA

## 2020-01-05 ENCOUNTER — Telehealth: Payer: Self-pay

## 2020-01-05 NOTE — Telephone Encounter (Signed)
Patient calls nurse line stating his back has been bothering and he is scared to take tylenol, due to this putting him in the hospital last year. Patient is wondering what OTC medication is safe for him to take. Please advise.

## 2020-01-09 NOTE — Telephone Encounter (Signed)
Patient informed. Moosa Bueche T Sendy Pluta, CMA  

## 2020-01-09 NOTE — Telephone Encounter (Signed)
[name] can take Aleve 2 times per day with food on days that his back is hurting him.  He should not take this every day, but it is safe to take occasionally.

## 2020-01-16 ENCOUNTER — Other Ambulatory Visit: Payer: Self-pay | Admitting: Family Medicine

## 2020-02-06 ENCOUNTER — Telehealth: Payer: Self-pay | Admitting: *Deleted

## 2020-02-06 NOTE — Telephone Encounter (Signed)
-----   Message from Jennette Bill, CMA sent at 01/26/2020  9:25 AM EDT ----- Regarding: Cologuard Cologuard due

## 2020-02-06 NOTE — Telephone Encounter (Signed)
LVm for pt to call office to remind him to complete his cologuard before it expires.Xavier Munger Zimmerman Rumple, CMA

## 2020-02-17 ENCOUNTER — Other Ambulatory Visit: Payer: Self-pay | Admitting: Family Medicine

## 2020-02-17 DIAGNOSIS — I5032 Chronic diastolic (congestive) heart failure: Secondary | ICD-10-CM

## 2020-02-23 ENCOUNTER — Ambulatory Visit: Payer: Medicare Other | Admitting: Family Medicine

## 2020-02-27 ENCOUNTER — Telehealth: Payer: Self-pay | Admitting: Family Medicine

## 2020-02-27 NOTE — Telephone Encounter (Signed)
Called patient to set up AWV, Voicemail was full. If pt calls back schedule AWV.

## 2020-03-11 ENCOUNTER — Ambulatory Visit: Payer: Medicare Other | Admitting: Family Medicine

## 2020-03-23 ENCOUNTER — Other Ambulatory Visit: Payer: Self-pay | Admitting: Family Medicine

## 2020-04-02 ENCOUNTER — Other Ambulatory Visit: Payer: Self-pay | Admitting: Family Medicine

## 2020-04-02 ENCOUNTER — Other Ambulatory Visit: Payer: Self-pay

## 2020-04-02 DIAGNOSIS — I10 Essential (primary) hypertension: Secondary | ICD-10-CM

## 2020-04-03 MED ORDER — TRAMADOL HCL 50 MG PO TABS: ORAL_TABLET | ORAL | 0 refills | Status: DC

## 2020-04-12 ENCOUNTER — Other Ambulatory Visit: Payer: Self-pay

## 2020-04-12 NOTE — Telephone Encounter (Signed)
Patient calls nurse line stating that he has not received his rx for tramadol. According to chart, rx was sent in on 7/14. Called and spoke with pharmacist, they state they never received medication refill. Pending new rx request and routing to PCP  Veronda Prude, RN

## 2020-04-12 NOTE — Telephone Encounter (Signed)
Received fax for this refill as well today.Seth Sanders, CMA

## 2020-04-13 ENCOUNTER — Other Ambulatory Visit: Payer: Self-pay | Admitting: Family Medicine

## 2020-04-16 MED ORDER — TRAMADOL HCL 50 MG PO TABS: ORAL_TABLET | ORAL | 0 refills | Status: DC

## 2020-04-18 ENCOUNTER — Other Ambulatory Visit: Payer: Self-pay | Admitting: Family Medicine

## 2020-04-18 ENCOUNTER — Telehealth: Payer: Self-pay | Admitting: Family Medicine

## 2020-04-18 MED ORDER — TRAMADOL HCL 50 MG PO TABS: ORAL_TABLET | ORAL | 0 refills | Status: DC

## 2020-04-18 NOTE — Telephone Encounter (Signed)
Called patient regarding Tramadol refill however no answer and VM full. Will try again later.

## 2020-04-18 NOTE — Addendum Note (Signed)
Addended by: Veronda Prude on: 04/18/2020 02:24 PM   Modules accepted: Orders

## 2020-04-18 NOTE — Telephone Encounter (Signed)
Patient returns call to nurse line expressing frustrations with getting tramadol refilled. Called and spoke with pharmacist, [facility], that states they are unable to fill under Dr. Allena Katz. Requesting supervising physician to resend rx. Will forward to preceptor (Dr. Manson Passey).   Veronda Prude, RN

## 2020-04-18 NOTE — Addendum Note (Signed)
Addended by: Manson Passey, Jassiah Viviano on: 04/18/2020 03:06 PM   Modules accepted: Orders

## 2020-04-18 NOTE — Telephone Encounter (Signed)
PMP reviewed. Discussed with Dr. Allena Katz. She spoke with patient. Will refill only 10 tablets, no further refills.  Terisa Starr, MD  Family Medicine Teaching Service

## 2020-04-19 ENCOUNTER — Telehealth: Payer: Self-pay | Admitting: Family Medicine

## 2020-04-19 NOTE — Telephone Encounter (Signed)
Called patient and let him know that his Tramadol has been prescribed and available at the pharmacy. If he requires further controlled substances he will need to book a clinic visit. Pt expressed understanding.

## 2020-07-04 ENCOUNTER — Ambulatory Visit: Payer: Medicare Other | Admitting: Family Medicine

## 2020-07-09 ENCOUNTER — Other Ambulatory Visit: Payer: Self-pay

## 2020-07-09 MED ORDER — ATORVASTATIN CALCIUM 40 MG PO TABS: 40.0000 mg | ORAL_TABLET | Freq: Every day | ORAL | 11 refills | Status: DC

## 2020-07-09 NOTE — Telephone Encounter (Signed)
Pt requesting a 90 day supply. Tehya Leath T Vimal Derego, CMA  

## 2020-08-12 ENCOUNTER — Other Ambulatory Visit: Payer: Self-pay

## 2020-08-12 ENCOUNTER — Other Ambulatory Visit: Payer: Self-pay | Admitting: Family Medicine

## 2020-08-12 MED ORDER — INDAPAMIDE 2.5 MG PO TABS: ORAL_TABLET | ORAL | 0 refills | Status: DC

## 2020-08-12 NOTE — Telephone Encounter (Signed)
Patient calls nurse line requesting urgent refill on indapamide. Patient states that he was unaware that refills ran out on medication and is now completely out.   Will forward to PCP   Veronda Prude, RN

## 2020-09-16 ENCOUNTER — Encounter: Payer: Self-pay | Admitting: Family Medicine

## 2020-09-16 ENCOUNTER — Telehealth (INDEPENDENT_AMBULATORY_CARE_PROVIDER_SITE_OTHER): Payer: Medicare Other | Admitting: Family Medicine

## 2020-09-16 ENCOUNTER — Other Ambulatory Visit: Payer: Self-pay

## 2020-09-16 DIAGNOSIS — R059 Cough, unspecified: Secondary | ICD-10-CM | POA: Diagnosis not present

## 2020-09-16 DIAGNOSIS — Z720 Tobacco use: Secondary | ICD-10-CM | POA: Diagnosis not present

## 2020-09-16 MED ORDER — BENZONATATE 200 MG PO CAPS: 200.0000 mg | ORAL_CAPSULE | Freq: Three times a day (TID) | ORAL | 0 refills | Status: DC | PRN

## 2020-09-16 NOTE — Progress Notes (Signed)
[facility] Telemedicine Visit  Patient consented to have virtual visit and was identified by name and date of birth. Method of visit: Video was attempted, but technology challenges prevented patient from using video, so visit was conducted via telephone.  Encounter participants: Patient: Seth Sanders - located at home Provider: Unknown Jim - located at home Others (if applicable): none  Chief Complaint: cough  HPI:  CHRIST Seth Sanders is a 69 y.o. male who presents to discuss:  Cough Had a cold three weeks ago that went away after he took OTC medications Started off with sneezing and coughing Had congestion at night No fevers, shortness of breath, chest pain States that it comes and goes since then He feels like he needs to cough something up, but he can't No known sick contacts He was never tested for COVID He has been vaccinated against COVID and is due for booster in March CBGs have been in low 100s He smokes currently, about 1/2 ppd, has been smoking since age 66  He has thought about quitting No longer having congestion No swelling in his legs Still feels like his breathing is doing well No history of COPD  ROS: per HPI  Pertinent PMHx: Obesity, Tobacco Abuse, HFpEF, T2DM  Exam:  There were no vitals taken for this visit.  Respiratory: Speaking in complete sentences, no evidence of respiratory distress over the phone  Assessment/Plan:  Cough Past window needed for COVID testing.  No signs of prior COPD or current COPD exacerbation.  Otherwise very well aside from cough, which is most likely post-viral.  Discussed that tobacco use is likely not helping, which he understands, see below.  Also discussed that there is not great evidence for sough suppressants, but can try honey given well-controlled CBGs and will send Rx for tessalon perles.  Doubt ACE cough given timeline.  No signs of CHF exacerbation.  Return precautions discussed  including lack of improvement over next few weeks, development of SOB, fever, congestion, or other symptoms.  He voiced understanding.  Tobacco abuse Currently smoking 0.5ppd.  Has been smoking since age 85.  He has thought about quitting and might use patches.  Would like to think about this.  Advised follow up in clinic to provide him with resources for cessation.  Congratulated him on being vaccinated and thinking about quitting.    Time spent during visit with patient: 13 minutes

## 2020-09-16 NOTE — Assessment & Plan Note (Signed)
Past window needed for COVID testing.  No signs of prior COPD or current COPD exacerbation.  Otherwise very well aside from cough, which is most likely post-viral.  Discussed that tobacco use is likely not helping, which he understands, see below.  Also discussed that there is not great evidence for sough suppressants, but can try honey given well-controlled CBGs and will send Rx for tessalon perles.  Doubt ACE cough given timeline.  No signs of CHF exacerbation.  Return precautions discussed including lack of improvement over next few weeks, development of SOB, fever, congestion, or other symptoms.  He voiced understanding.

## 2020-09-16 NOTE — Assessment & Plan Note (Signed)
Currently smoking 0.5ppd.  Has been smoking since age 69.  He has thought about quitting and might use patches.  Would like to think about this.  Advised follow up in clinic to provide him with resources for cessation.  Congratulated him on being vaccinated and thinking about quitting.

## 2020-09-26 ENCOUNTER — Other Ambulatory Visit: Payer: Self-pay

## 2020-09-29 ENCOUNTER — Other Ambulatory Visit: Payer: Self-pay | Admitting: Family Medicine

## 2020-09-29 DIAGNOSIS — I10 Essential (primary) hypertension: Secondary | ICD-10-CM

## 2020-10-02 ENCOUNTER — Telehealth: Payer: Self-pay | Admitting: *Deleted

## 2020-10-02 NOTE — Telephone Encounter (Signed)
Noted and agreed, thank you Hannah.  

## 2020-10-02 NOTE — Telephone Encounter (Signed)
-----   Message from Towanda Octave, MD sent at 09/27/2020  7:08 AM EST ----- Regarding: Tramadol refill Hi team,  I received a request for a refill for tramadol for this pt. I called him back in Nov 21 and told him he needs a clinic visit to be able to get more refills. Please could you inform the pt?  Thank you, appreciate it.  [place]

## 2020-10-02 NOTE — Telephone Encounter (Signed)
LVM for pt to call office to inform him of below and to assist in getting him an appointment to see about refills.Ebert Forrester Zimmerman Rumple, CMA

## 2020-10-02 NOTE — Telephone Encounter (Signed)
Patient returns call to nurse line. Advised that patient needs to schedule appointment for future medication refills. Patient states "I have been in the bed for over a week with this pain, I will take some tylenol and call back to schedule appointment".   Will schedule patient with PCP when he returns call to office.   Veronda Prude, RN

## 2020-10-15 ENCOUNTER — Other Ambulatory Visit: Payer: Self-pay | Admitting: Family Medicine

## 2020-10-22 ENCOUNTER — Other Ambulatory Visit: Payer: Self-pay | Admitting: *Deleted

## 2020-10-22 NOTE — Telephone Encounter (Signed)
error 

## 2020-11-18 ENCOUNTER — Other Ambulatory Visit: Payer: Self-pay

## 2020-11-18 MED ORDER — SPIRONOLACTONE 25 MG PO TABS: ORAL_TABLET | ORAL | 11 refills | Status: DC

## 2020-11-20 ENCOUNTER — Other Ambulatory Visit: Payer: Self-pay

## 2020-11-20 ENCOUNTER — Ambulatory Visit (INDEPENDENT_AMBULATORY_CARE_PROVIDER_SITE_OTHER): Payer: Medicare Other | Admitting: Student in an Organized Health Care Education/Training Program

## 2020-11-20 VITALS — BP 166/66 | HR 86 | Wt 330.4 lb

## 2020-11-20 DIAGNOSIS — E1165 Type 2 diabetes mellitus with hyperglycemia: Secondary | ICD-10-CM | POA: Diagnosis not present

## 2020-11-20 DIAGNOSIS — M545 Low back pain, unspecified: Secondary | ICD-10-CM

## 2020-11-20 DIAGNOSIS — G8929 Other chronic pain: Secondary | ICD-10-CM

## 2020-11-20 DIAGNOSIS — I10 Essential (primary) hypertension: Secondary | ICD-10-CM | POA: Diagnosis not present

## 2020-11-20 DIAGNOSIS — E118 Type 2 diabetes mellitus with unspecified complications: Secondary | ICD-10-CM | POA: Diagnosis not present

## 2020-11-20 LAB — POCT GLYCOSYLATED HEMOGLOBIN (HGB A1C): Hemoglobin A1C: 9.4 % — AB (ref 4.0–5.6)

## 2020-11-20 MED ORDER — ACETAMINOPHEN ER 650 MG PO TBCR: 650.0000 mg | EXTENDED_RELEASE_TABLET | Freq: Three times a day (TID) | ORAL | 1 refills | Status: DC | PRN

## 2020-11-20 MED ORDER — 1ST MEDX-PATCH/ LIDOCAINE 4-0.025-5-20 % EX PTCH: MEDICATED_PATCH | CUTANEOUS | 1 refills | Status: AC

## 2020-11-20 NOTE — Progress Notes (Signed)
   SUBJECTIVE:   CHIEF COMPLAINT / HPI: back pain  Chronic back pain but has been getting worse the past year.  Having difficulty sleeping, lost his job, feels to get in danger of losing his apartment.  Located in lower center back.  Has a jolt of pain when gets ready to stand.  Does not radiate.  Denies numbness, weakness, tingling. Denies any night sweats.  No rashes or unintentioinal weight loss No bowel or bladder incontinence. Walking or sitting long time hurts it the most..  Has not had any imaging.  He has been taking aleve but hasn't helped. Uses bengay also doesn't help. Took tramadol in the past and also didn't help.  OBJECTIVE:   BP (!) 166/66   Pulse 86   Wt (!) 330 lb 6.4 oz (149.9 kg)   SpO2 98%   BMI 44.81 kg/m   Physical Exam Vitals and nursing note reviewed.  Constitutional:      Appearance: He is obese. He is not ill-appearing or toxic-appearing.  Musculoskeletal:     Cervical back: Normal.     Thoracic back: Normal.     Lumbar back: Tenderness present. No swelling, edema, deformity, lacerations, spasms or bony tenderness. Normal range of motion. Negative right straight leg raise test and negative left straight leg raise test. No scoliosis.     Comments: Negative log roll, FABIR bilaterally  Neurological:     Mental Status: He is alert.    ASSESSMENT/PLAN:   Low back pain Chronic.  No history of imaging.  Most likely osteoarthritis without red flag symptoms. Obtain lumbar x-ray today. -Lidocaine patches -Extra strength Tylenol -Gabapentin -Discussed weight loss with diet and exercise -Physical therapy referral -Provided patient with home back rehab handout -Follow-up with PCP in 4 weeks  Type 2 diabetes mellitus with hyperglycemia (HCC) Nonfasting glucose 178 today Hemoglobin A1c collected Follow-up with PCP  Hypercalcemia Mildly elevated Follow-up with PCP for repeat BMP  Essential hypertension Poorly controlled.  Patient only taking  Lasix. Requesting medication refills. Obtain lipids, BMP today and refill lisinopril Patient has several other blood pressure medications listed but I am in favor of starting them back slowly if he has not been taking any more recently and having him follow-up soon with his PCP to evaluate if he needs further medication changes. It looks like he has several more refills of amlodipine available to him   Leeroy Bock, DO [facility]

## 2020-11-20 NOTE — Patient Instructions (Signed)
It was a pleasure to see you today!  To summarize our discussion for this visit:  Today we discussed your back pain  We will get an xray of your back  Refer to physical therapy  Prescribed tylenol and lidocaine patches, refilling gabapentin  Recommend losing weight with diet and exercise  For your blood pressure  We are checking some blood work   I am refilling your medications  Some additional health maintenance measures we should update are: Health Maintenance Due  Topic Date Due  . COVID-19 Vaccine (1) Never done  . TETANUS/TDAP  Never done  . COLONOSCOPY (Pts 45-6yrs Insurance coverage will need to be confirmed)  Never done  . OPHTHALMOLOGY EXAM  01/19/2014  . PNA vac Low Risk Adult (2 of 2 - PPSV23) 11/08/2018  . FOOT EXAM  06/25/2019  . HEMOGLOBIN A1C  11/15/2019  . INFLUENZA VACCINE  Never done  .   Please return to our clinic to seeyour PCP in 4 weeks.  Call the clinic at [phone] if your symptoms worsen or you have any concerns.   Thank you for allowing me to take part in your care,  Dr. Jamelle Rushing   Back Exercises These exercises help to make your trunk and back strong. They also help to keep the lower back flexible. Doing these exercises can help to prevent back pain or lessen existing pain.  If you have back pain, try to do these exercises 2-3 times each day or as told by your doctor.  As you get better, do the exercises once each day. Repeat the exercises more often as told by your doctor.  To stop back pain from coming back, do the exercises once each day, or as told by your doctor. Exercises Single knee to chest Do these steps 3-5 times in a row for each leg: 1. Lie on your back on a firm bed or the floor with your legs stretched out. 2. Bring one knee to your chest. 3. Grab your knee or thigh with both hands and hold them it in place. 4. Pull on your knee until you feel a gentle stretch in your lower back or buttocks. 5. Keep doing  the stretch for 10-30 seconds. 6. Slowly let go of your leg and straighten it. Pelvic tilt Do these steps 5-10 times in a row: 1. Lie on your back on a firm bed or the floor with your legs stretched out. 2. Bend your knees so they point up to the ceiling. Your feet should be flat on the floor. 3. Tighten your lower belly (abdomen) muscles to press your lower back against the floor. This will make your tailbone point up to the ceiling instead of pointing down to your feet or the floor. 4. Stay in this position for 5-10 seconds while you gently tighten your muscles and breathe evenly. Cat-cow Do these steps until your lower back bends more easily: 1. Get on your hands and knees on a firm surface. Keep your hands under your shoulders, and keep your knees under your hips. You may put padding under your knees. 2. Let your head hang down toward your chest. Tighten (contract) the muscles in your belly. Point your tailbone toward the floor so your lower back becomes rounded like the back of a cat. 3. Stay in this position for 5 seconds. 4. Slowly lift your head. Let the muscles of your belly relax. Point your tailbone up toward the ceiling so your back forms a sagging arch like  the back of a cow. 5. Stay in this position for 5 seconds.   Press-ups Do these steps 5-10 times in a row: 1. Lie on your belly (face-down) on the floor. 2. Place your hands near your head, about shoulder-width apart. 3. While you keep your back relaxed and keep your hips on the floor, slowly straighten your arms to raise the top half of your body and lift your shoulders. Do not use your back muscles. You may change where you place your hands in order to make yourself more comfortable. 4. Stay in this position for 5 seconds. 5. Slowly return to lying flat on the floor.   Bridges Do these steps 10 times in a row: 1. Lie on your back on a firm surface. 2. Bend your knees so they point up to the ceiling. Your feet should be flat  on the floor. Your arms should be flat at your sides, next to your body. 3. Tighten your butt muscles and lift your butt off the floor until your waist is almost as high as your knees. If you do not feel the muscles working in your butt and the back of your thighs, slide your feet 1-2 inches farther away from your butt. 4. Stay in this position for 3-5 seconds. 5. Slowly lower your butt to the floor, and let your butt muscles relax. If this exercise is too easy, try doing it with your arms crossed over your chest.   Belly crunches Do these steps 5-10 times in a row: 1. Lie on your back on a firm bed or the floor with your legs stretched out. 2. Bend your knees so they point up to the ceiling. Your feet should be flat on the floor. 3. Cross your arms over your chest. 4. Tip your chin a little bit toward your chest but do not bend your neck. 5. Tighten your belly muscles and slowly raise your chest just enough to lift your shoulder blades a tiny bit off of the floor. Avoid raising your body higher than that, because it can put too much stress on your low back. 6. Slowly lower your chest and your head to the floor. Back lifts Do these steps 5-10 times in a row: 1. Lie on your belly (face-down) with your arms at your sides, and rest your forehead on the floor. 2. Tighten the muscles in your legs and your butt. 3. Slowly lift your chest off of the floor while you keep your hips on the floor. Keep the back of your head in line with the curve in your back. Look at the floor while you do this. 4. Stay in this position for 3-5 seconds. 5. Slowly lower your chest and your face to the floor. Contact a doctor if:  Your back pain gets a lot worse when you do an exercise.  Your back pain does not get better 2 hours after you exercise. If you have any of these problems, stop doing the exercises. Do not do them again unless your doctor says it is okay. Get help right away if:  You have sudden, very bad  back pain. If this happens, stop doing the exercises. Do not do them again unless your doctor says it is okay. This information is not intended to replace advice given to you by your health care provider. Make sure you discuss any questions you have with your health care provider. Document Revised: 06/02/2018 Document Reviewed: 06/02/2018 Elsevier Patient Education  2021 [employer].

## 2020-11-21 DIAGNOSIS — E1165 Type 2 diabetes mellitus with hyperglycemia: Secondary | ICD-10-CM | POA: Insufficient documentation

## 2020-11-21 HISTORY — DX: Type 2 diabetes mellitus with hyperglycemia: E11.65

## 2020-11-21 LAB — COMPREHENSIVE METABOLIC PANEL
ALT: 19 IU/L (ref 0–44)
AST: 14 IU/L (ref 0–40)
Albumin/Globulin Ratio: 1.7 (ref 1.2–2.2)
Albumin: 4.5 g/dL (ref 3.8–4.8)
Alkaline Phosphatase: 64 IU/L (ref 44–121)
BUN/Creatinine Ratio: 23 (ref 10–24)
BUN: 24 mg/dL (ref 8–27)
Bilirubin Total: 0.4 mg/dL (ref 0.0–1.2)
CO2: 20 mmol/L (ref 20–29)
Calcium: 10.6 mg/dL — ABNORMAL HIGH (ref 8.6–10.2)
Chloride: 101 mmol/L (ref 96–106)
Creatinine, Ser: 1.04 mg/dL (ref 0.76–1.27)
Globulin, Total: 2.6 g/dL (ref 1.5–4.5)
Glucose: 178 mg/dL — ABNORMAL HIGH (ref 65–99)
Potassium: 4.1 mmol/L (ref 3.5–5.2)
Sodium: 139 mmol/L (ref 134–144)
Total Protein: 7.1 g/dL (ref 6.0–8.5)
eGFR: 78 mL/min/{1.73_m2} (ref >59)

## 2020-11-21 LAB — LIPID PANEL
Chol/HDL Ratio: 2.5 ratio (ref 0.0–5.0)
Cholesterol, Total: 122 mg/dL (ref 100–199)
HDL: 48 mg/dL (ref >39)
LDL Chol Calc (NIH): 58 mg/dL (ref 0–99)
Triglycerides: 79 mg/dL (ref 0–149)
VLDL Cholesterol Cal: 16 mg/dL (ref 5–40)

## 2020-11-21 LAB — CBC
Hematocrit: 39.1 % (ref 37.5–51.0)
Hemoglobin: 13.1 g/dL (ref 13.0–17.7)
MCH: 29 pg (ref 26.6–33.0)
MCHC: 33.5 g/dL (ref 31.5–35.7)
MCV: 87 fL (ref 79–97)
Platelets: 216 10*3/uL (ref 150–450)
RBC: 4.52 x10E6/uL (ref 4.14–5.80)
RDW: 12.8 % (ref 11.6–15.4)
WBC: 12.5 10*3/uL — ABNORMAL HIGH (ref 3.4–10.8)

## 2020-11-21 MED ORDER — LISINOPRIL 40 MG PO TABS: ORAL_TABLET | ORAL | 0 refills | Status: DC

## 2020-11-21 NOTE — Assessment & Plan Note (Signed)
Mildly elevated Follow-up with PCP for repeat BMP

## 2020-11-21 NOTE — Assessment & Plan Note (Signed)
Nonfasting glucose 178 today Hemoglobin A1c collected Follow-up with PCP

## 2020-11-21 NOTE — Assessment & Plan Note (Signed)
Chronic.  No history of imaging.  Most likely osteoarthritis without red flag symptoms. Obtain lumbar x-ray today. -Lidocaine patches -Extra strength Tylenol -Gabapentin -Discussed weight loss with diet and exercise -Physical therapy referral -Provided patient with home back rehab handout -Follow-up with PCP in 4 weeks

## 2020-11-21 NOTE — Assessment & Plan Note (Signed)
Poorly controlled.  Patient only taking Lasix. Requesting medication refills. Obtain lipids, BMP today and refill lisinopril Patient has several other blood pressure medications listed but I am in favor of starting them back slowly if he has not been taking any more recently and having him follow-up soon with his PCP to evaluate if he needs further medication changes. It looks like he has several more refills of amlodipine available to him

## 2020-11-22 ENCOUNTER — Other Ambulatory Visit: Payer: Self-pay

## 2020-11-24 MED ORDER — TRAMADOL HCL 50 MG PO TABS: ORAL_TABLET | ORAL | 0 refills | Status: DC

## 2020-11-24 NOTE — Patient Instructions (Signed)
It was great to see you! Thank you for allowing me to participate in your care!  I recommend that you always bring your medications to each appointment as this makes it easy to ensure we are on the correct medications and helps [place] not miss when refills are needed.  Our plans for today:  -I do think it is important to get the x-rays as discussed at your last appointment.  You can go to [address], the [place] imaging center to have those done.  You do not need an appointment for this.  I recommend that you go today. -Continue current medications for back pain. -If you develop any numbness to the region of your crotch, any loss of bowel or bladder function please let us know immediately -I recommend that you do go to physical therapy once you get scheduled for this.  They should call you within 1 week of your appointment with Dr. Dareen Piano, which was when this was ordered.  Take care and seek immediate care sooner if you develop any concerns.   Dr. Jackelyn Poling, DO [facility]

## 2020-11-24 NOTE — Progress Notes (Signed)
    SUBJECTIVE:   CHIEF COMPLAINT / HPI:   Back pain: Patient is a 70 year old male that presents today to discuss back pain. Patient was seen on 11/20/2020 to discuss his chronic low back pain that was thought to be most likely due to osteoarthritis as the patient also had no red flag symptoms.  A lumbar x-ray was ordered and the patient was recommended to use lidocaine patches and extra strength Tylenol as well as gabapentin.  Patient was discussed the importance of weight loss, diet and exercise and physical therapy referral was placed.  At that time was recommended the patient follow-up with his PCP in 4 weeks.  Patient denies any loss of bowel or bladder function, denies saddle paresthesias.  He states he did not know he was supposed to go and get x-rays.  PERTINENT  PMH / PSH: History of chronic back pain  OBJECTIVE:   BP (!) 186/70   Pulse 77   Ht 6' (1.829 m)   Wt (!) 330 lb 6.4 oz (149.9 kg)   SpO2 98%   BMI 44.81 kg/m    General: NAD, pleasant, able to participate in exam Cardiac: S1, S2 present. Respiratory: CTAB, normal effort MSK: No palpable areas of tenderness to the region of the central spine, no step-offs noted, no obvious bony abnormalities on visual exam of the patient's back.  Patient does have tenderness to the musculature on both sides of the back lateral to the spine in the region of L4-L5.  He has a negative straight leg raise test. Neuro: alert, no obvious focal deficits Psych: Normal affect and mood  ASSESSMENT/PLAN:   Low back pain Chronic low back pain.  No red flag symptoms.  Patient was seen 4 days ago by provider who ordered physical therapy, x-rays, and some pain control which the patient has not yet picked up, or had performed.  Physical exam with no palpable areas of tenderness on the midline spine and no step-offs.  Patient does have discomfort to the soft tissue on the lateral aspect of the spine in the lumbar region. Plan: -Recommended patient get  the lumbar x-rays as ordered by previous provider -Recommended he pick up the prescriptions as ordered by last provider -Recommended he follow-up with physical therapy as ordered by last provider.     Jackelyn Poling, DO [facility]    This note was prepared using Dragon voice recognition software and may include unintentional dictation errors due to the inherent limitations of voice recognition software.

## 2020-11-25 ENCOUNTER — Ambulatory Visit (INDEPENDENT_AMBULATORY_CARE_PROVIDER_SITE_OTHER): Payer: Medicare Other | Admitting: Family Medicine

## 2020-11-25 ENCOUNTER — Other Ambulatory Visit: Payer: Self-pay

## 2020-11-25 DIAGNOSIS — M545 Low back pain, unspecified: Secondary | ICD-10-CM

## 2020-11-25 DIAGNOSIS — G8929 Other chronic pain: Secondary | ICD-10-CM

## 2020-11-25 NOTE — Assessment & Plan Note (Addendum)
Chronic low back pain.  No red flag symptoms.  Patient was seen 4 days ago by provider who ordered physical therapy, x-rays, and some pain control which the patient has not yet picked up, or had performed.  Physical exam with no palpable areas of tenderness on the midline spine and no step-offs.  Patient does have discomfort to the soft tissue on the lateral aspect of the spine in the lumbar region. Plan: -Recommended patient get the lumbar x-rays as ordered by previous provider -Recommended he pick up the prescriptions as ordered by last provider -Recommended he follow-up with physical therapy as ordered by last provider.

## 2020-12-25 NOTE — Progress Notes (Signed)
SUBJECTIVE:   CHIEF COMPLAINT / HPI:   Seth Sanders is a 70 y.o. male presents for follow-up for back pain  Back pain Patient was seen at [facility] on 3/7 by Dr. Vincente Poli and prior to this Dr Dareen Piano on 3/2.    Thought to have chronic low back pain without red flag symptoms.  Recommended lumbar x-rays, physical therapy and lidocaine patch, Tylenol.  Patient reports he has not collected these medications as he did not know they were sent to the pharmacy. He thought it was not allowed to take tylenol due to the risk of GI bleed. He also has not done physical therapy. He is unable to do much with his lumbar back pain. He has had since November last year.   Denies history of trauma, history of prolonged steroid use, bowel or bladder incontinence, urinary retention, numbness or tingling in extremities or saddle anesthesia, weakness in extremities, fevers, chills, IV drug use, hemodialysis, hx cancer, changes in weight, night sweats. No history of osteoporosis.  No history of prostate cancer.  Occupation: Pt is currently unemployed. No heavy lifting, repetitive vibrations, bending or twisting motions.   Flowsheet Row Office Visit from 12/26/2020 in [facility]  PHQ-9 Total Score 24       Health Maintenance Due  Topic  . COVID-19 Vaccine (1)  . COLONOSCOPY (Pts 45-55yrs Insurance coverage will need to be confirmed)   . OPHTHALMOLOGY EXAM   . PNA vac Low Risk Adult (2 of 2 - PPSV23)  . FOOT EXAM       Hypertension Patient's current antihypertensive  medications include: Amlodipine, spironolactone, lisinopril and lasix. He endorses compliance with medications and tolerating well without side effects.  Does not check Bps at home.  Denies any SOB, CP, vision changes, LE edema, medication SEs, or symptoms of hypotension.   Most recent creatinine trend:  Lab Results  Component Value Date   CREATININE 1.04 11/20/2020   CREATININE 0.90 06/24/2018   CREATININE 0.71 (L)  11/08/2017     Patient has had a BMP in the past 1 year.   PERTINENT  PMH / PSH: HTN  OBJECTIVE:   BP (!) 150/76   Pulse 67   Ht 6' (1.829 m)   Wt (!) 328 lb 6.4 oz (149 kg)   SpO2 95%   BMI 44.54 kg/m    General: Alert, no acute distress, obese male  Cardio: well perfused  Pulm: normal work of breathing Extremities: No peripheral edema.  Neuro: Cranial nerves grossly intact  Back Normal skin, Spine with normal alignment and no deformity.  Tenderness on palpation of L5-S1.  Paraspinous muscles are not tender and without spasm.   Range of motion is full at neck and lumbar sacral regions. Negative straight leg test.  ASSESSMENT/PLAN:   Low back pain Uncontrolled chronic lower back pain, likely secondary to arthritis.  No red flag symptoms today.  Patient has seen 2 other doctors in the clinic who had ordered x-rays, physical therapy and analgesia however patient is yet to collect these.  Reiterated the importance of the getting these x-rays and collecting the medication from the pharmacy.  Also recommended physical therapy and weight loss to aid with back pain.  Follow-up in 2-4 weeks after the lumbar x-ray results.  Essential hypertension BP 150/90, uncontrolled today.  Could be worse due to uncontrolled back pain.  Unsure whether pt is compliant with antihypertensive medications.  Listed as taking spironolactone, lisinopril, amlodipine.  Recommended BP  follow-up in 2 weeks and recommended patient brings all his medications with him.  No changes to medications today.     Towanda Octave, MD PGY-2 [facility]

## 2020-12-26 ENCOUNTER — Ambulatory Visit (INDEPENDENT_AMBULATORY_CARE_PROVIDER_SITE_OTHER): Payer: Medicare Other | Admitting: Family Medicine

## 2020-12-26 ENCOUNTER — Other Ambulatory Visit: Payer: Self-pay

## 2020-12-26 ENCOUNTER — Encounter: Payer: Self-pay | Admitting: Family Medicine

## 2020-12-26 DIAGNOSIS — I10 Essential (primary) hypertension: Secondary | ICD-10-CM | POA: Diagnosis not present

## 2020-12-26 DIAGNOSIS — G8929 Other chronic pain: Secondary | ICD-10-CM | POA: Diagnosis not present

## 2020-12-26 DIAGNOSIS — M545 Low back pain, unspecified: Secondary | ICD-10-CM | POA: Diagnosis not present

## 2020-12-26 MED ORDER — GABAPENTIN 300 MG PO CAPS: 300.0000 mg | ORAL_CAPSULE | Freq: Three times a day (TID) | ORAL | 1 refills | Status: DC | PRN

## 2020-12-26 NOTE — Patient Instructions (Signed)
Thank you for coming to see me today. It was a pleasure. Today we discussed your lower back pain.  It is likely due to arthritis.  I recommend collecting the Tylenol, gabapentin, lidocaine patches from the pharmacy and doing physical therapy as soon as possible.  Weight loss is also going to help with the back pain.  Try to keep as active as possible. Together all these factors will help with the back pain.   Please follow-up with me in 2 for blood pressure check.  We may need to increase the dose of your blood pressure medicines.  Please bring all your medications with you to this visit.  If you have any questions or concerns, please do not hesitate to call the office at [phone].  Best wishes,   Dr Allena Katz

## 2020-12-26 NOTE — Telephone Encounter (Signed)
Patient calls nurse line requesting a refill on Gabapentin. Please advise.

## 2020-12-26 NOTE — Assessment & Plan Note (Addendum)
BP 150/90, uncontrolled today.  Could be worse due to uncontrolled back pain.  Unsure whether pt is compliant with antihypertensive medications.  Listed as taking spironolactone, lisinopril, amlodipine.  Recommended BP follow-up in 2 weeks and recommended patient brings all his medications with him.  No changes to medications today.

## 2020-12-26 NOTE — Assessment & Plan Note (Addendum)
Uncontrolled chronic lower back pain, likely secondary to arthritis.  No red flag symptoms today.  Patient has seen 2 other doctors in the clinic who had ordered x-rays, physical therapy and analgesia however patient is yet to collect these.  Reiterated the importance of the getting these x-rays and collecting the medication from the pharmacy.  Also recommended physical therapy and weight loss to aid with back pain.  Follow-up in 2-4 weeks after the lumbar x-ray results.

## 2020-12-31 ENCOUNTER — Other Ambulatory Visit (HOSPITAL_COMMUNITY): Payer: Self-pay

## 2021-01-13 ENCOUNTER — Ambulatory Visit: Payer: Medicare Other

## 2021-02-12 ENCOUNTER — Other Ambulatory Visit: Payer: Self-pay | Admitting: Family Medicine

## 2021-02-21 ENCOUNTER — Other Ambulatory Visit: Payer: Self-pay

## 2021-02-21 DIAGNOSIS — I5032 Chronic diastolic (congestive) heart failure: Secondary | ICD-10-CM

## 2021-02-21 MED ORDER — FUROSEMIDE 20 MG PO TABS: ORAL_TABLET | ORAL | 3 refills | Status: DC

## 2021-03-24 ENCOUNTER — Other Ambulatory Visit: Payer: Self-pay | Admitting: Family Medicine

## 2021-03-24 DIAGNOSIS — I10 Essential (primary) hypertension: Secondary | ICD-10-CM

## 2021-03-28 ENCOUNTER — Other Ambulatory Visit: Payer: Self-pay | Admitting: Family Medicine

## 2021-03-28 DIAGNOSIS — I10 Essential (primary) hypertension: Secondary | ICD-10-CM

## 2021-04-02 ENCOUNTER — Other Ambulatory Visit: Payer: Self-pay

## 2021-04-02 ENCOUNTER — Telehealth: Payer: Medicare Other | Admitting: Family Medicine

## 2021-04-02 DIAGNOSIS — I10 Essential (primary) hypertension: Secondary | ICD-10-CM

## 2021-04-02 NOTE — Telephone Encounter (Signed)
Patient LVM on nurse line stating his apt was canceled today, however he needs his medications refilled. Patient has scheduled an apt for 8/3 with PCP. Please advise on refill.

## 2021-04-03 MED ORDER — AMLODIPINE BESYLATE 10 MG PO TABS: ORAL_TABLET | ORAL | 1 refills | Status: DC

## 2021-04-03 MED ORDER — ATORVASTATIN CALCIUM 40 MG PO TABS: 40.0000 mg | ORAL_TABLET | Freq: Every day | ORAL | 11 refills | Status: DC

## 2021-04-03 NOTE — Telephone Encounter (Signed)
Pt will need in person visit for further refills of tramadol. Please inform him. Thank you.

## 2021-04-09 ENCOUNTER — Other Ambulatory Visit: Payer: Self-pay | Admitting: Family Medicine

## 2021-04-09 NOTE — Telephone Encounter (Signed)
Pt will need clinic appointment to discuss the need for tramadol. Please inform him. Thank you.

## 2021-04-12 ENCOUNTER — Other Ambulatory Visit: Payer: Self-pay | Admitting: Family Medicine

## 2021-04-14 MED ORDER — ALLOPURINOL 300 MG PO TABS: 150.0000 mg | ORAL_TABLET | Freq: Every day | ORAL | 3 refills | Status: DC

## 2021-04-14 NOTE — Telephone Encounter (Signed)
Patient calls nurse line regarding issues with prescriptions. Per chart review, transmission to pharmacy failed when metformin was signed on 7/20. I have resent this prescription to the pharmacy.   Patient is also requesting refills on allopurinol.   Please advise.   Veronda Prude, RN

## 2021-04-23 ENCOUNTER — Encounter: Payer: Self-pay | Admitting: Family Medicine

## 2021-04-23 ENCOUNTER — Other Ambulatory Visit: Payer: Self-pay

## 2021-04-23 ENCOUNTER — Ambulatory Visit (INDEPENDENT_AMBULATORY_CARE_PROVIDER_SITE_OTHER): Payer: Medicare Other | Admitting: Family Medicine

## 2021-04-23 DIAGNOSIS — I10 Essential (primary) hypertension: Secondary | ICD-10-CM

## 2021-04-23 DIAGNOSIS — I5032 Chronic diastolic (congestive) heart failure: Secondary | ICD-10-CM

## 2021-04-23 DIAGNOSIS — G8929 Other chronic pain: Secondary | ICD-10-CM

## 2021-04-23 DIAGNOSIS — M545 Low back pain, unspecified: Secondary | ICD-10-CM | POA: Diagnosis not present

## 2021-04-23 DIAGNOSIS — E1165 Type 2 diabetes mellitus with hyperglycemia: Secondary | ICD-10-CM | POA: Diagnosis not present

## 2021-04-23 DIAGNOSIS — E118 Type 2 diabetes mellitus with unspecified complications: Secondary | ICD-10-CM | POA: Diagnosis not present

## 2021-04-23 LAB — POCT GLYCOSYLATED HEMOGLOBIN (HGB A1C): HbA1c, POC (controlled diabetic range): 7.9 % — AB (ref 0.0–7.0)

## 2021-04-23 MED ORDER — METFORMIN HCL 1000 MG PO TABS: ORAL_TABLET | ORAL | 3 refills | Status: DC

## 2021-04-23 MED ORDER — FUROSEMIDE 20 MG PO TABS: ORAL_TABLET | ORAL | 3 refills | Status: DC

## 2021-04-23 MED ORDER — TRAMADOL HCL 50 MG PO TABS: ORAL_TABLET | ORAL | 0 refills | Status: DC

## 2021-04-23 MED ORDER — SPIRONOLACTONE 25 MG PO TABS: ORAL_TABLET | ORAL | 11 refills | Status: DC

## 2021-04-23 MED ORDER — LISINOPRIL 40 MG PO TABS: ORAL_TABLET | ORAL | 3 refills | Status: DC

## 2021-04-23 NOTE — Assessment & Plan Note (Addendum)
A1c 7.9 and at goal. Congratulated pt. Continue Metformin 1000mg  BID. Follow up in 3 months.

## 2021-04-23 NOTE — Progress Notes (Signed)
     SUBJECTIVE:   CHIEF COMPLAINT / HPI:   Seth Sanders is a 70 y.o. male presents for medication refill  Back pain Pt takes Tramadol PRN for back pain, 50mg  PRN. The last time he took it was a few months ago. He has been doing 'so and so'. It keeps him from doing his house work. When he has tramadol it gives him a better quality of life. He is requesting 14 tablets which will last him the next few months.    Diabetes Patient's current diabetic medications include metformin. Tolerating well without side effects.  Patient endorses compliance with these medications. CBG readings averaging in the 120s.  Patient's last A1c was  Lab Results  Component Value Date   HGBA1C 7.9 (A) 04/23/2021   HGBA1C 9.4 (A) 11/20/2020   HGBA1C 6.9 05/15/2019    Denies abdominal pain, blurred vision, polyuria, polydipsia, hypoglycemia. Patient states they understand that diet and exercise can help with her diabetes..    Last Microalbumin, LDL, Creatinine: Lab Results  Component Value Date   MICROALBUR 3.68 (H) 08/13/2010   LDLCALC 58 11/20/2020   CREATININE 1.04 11/20/2020    Hypertension Patient's current antihypertensive  medications include: Pt is unsure which medications he takes.  Denies any SOB, CP, vision changes, LE edema, medication SEs, or symptoms of hypotension.   Most recent creatinine trend:  Lab Results  Component Value Date   CREATININE 1.04 11/20/2020   CREATININE 0.90 06/24/2018   CREATININE 0.71 (L) 11/08/2017     Patient has had a BMP in the past 1 year.   PERTINENT  PMH / PSH: T2DM, HTN, HLD  OBJECTIVE:   BP (!) 163/57   Pulse 77   Ht 6' (1.829 m)   Wt (!) 318 lb 9.6 oz (144.5 kg)   SpO2 99%   BMI 43.21 kg/m    Repeat  BP 148/60  General: Alert, no acute distress Cardio: Normal S1 and S2, RRR, no r/m/g Pulm: CTAB, normal work of breathing Abdomen: Bowel sounds normal. Abdomen soft and non-tender.  Extremities: No peripheral edema.  Neuro: Cranial nerves  grossly intact   ASSESSMENT/PLAN:   Type 2 diabetes mellitus with hyperglycemia (HCC) A1c 7.9 and at goal. Congratulated pt. Continue Metformin 1000mg  BID. Follow up in 3 months.  Essential hypertension Uncontrolled HTN. BP 163/51. Pt is unsure which medications he is taking. I recommended follow up in 2 weeks and that patient brings all of his medications with him. No changes made today.   Low back pain Chronic back pain. Refilled tramadol 50mg  PRN, 14 tablets. Pt said this usually lasts him 2-3 months. Follow up in 2-3 months in clinic for refill.     11/10/2017, MD PGY-3 [facility]

## 2021-04-23 NOTE — Patient Instructions (Signed)
Thank you for coming to see me today. It was a pleasure. Today we discussed diabetes, blood pressure and back pain.  -Your diabetes is well controlled-well-done exhalation Mark please continue metformin 1000 mg twice a day -We have back pain I will give you 14 tablets of tramadol which should last you between 2 to 3 months. -Your blood pressure was a little high today, please come back with all your medications and you can check your blood pressure again.  Please follow-up with me in 2 to 3 weeks weeks   If you have any questions or concerns, please do not hesitate to call the office at [phone].  Best wishes,   Dr Allena Katz

## 2021-04-27 NOTE — Assessment & Plan Note (Signed)
Uncontrolled HTN. BP 163/51. Pt is unsure which medications he is taking. I recommended follow up in 2 weeks and that patient brings all of his medications with him. No changes made today.

## 2021-04-27 NOTE — Assessment & Plan Note (Addendum)
Chronic back pain. Refilled tramadol 50mg  PRN, 14 tablets. Pt said this usually lasts him 2-3 months. Follow up in 2-3 months in clinic for refill.

## 2021-07-06 ENCOUNTER — Other Ambulatory Visit: Payer: Self-pay | Admitting: Family Medicine

## 2021-08-08 ENCOUNTER — Other Ambulatory Visit: Payer: Self-pay | Admitting: Family Medicine

## 2021-08-12 ENCOUNTER — Other Ambulatory Visit: Payer: Self-pay | Admitting: Family Medicine

## 2021-08-18 ENCOUNTER — Other Ambulatory Visit: Payer: Self-pay | Admitting: Family Medicine

## 2021-09-04 ENCOUNTER — Ambulatory Visit: Payer: Medicare Other | Admitting: Family Medicine

## 2021-09-04 NOTE — Progress Notes (Deleted)
° ° °  SUBJECTIVE:   CHIEF COMPLAINT / HPI: gout   Patient presents with concern for gout. He reports taking allopurinol 150mg  daily ***    PERTINENT  PMH / PSH: HTN Diastolic HF    OBJECTIVE:   There were no vitals taken for this visit.  Physical Exam   ASSESSMENT/PLAN:   No problem-specific Assessment & Plan notes found for this encounter.     , MD [facility]

## 2021-09-08 ENCOUNTER — Ambulatory Visit: Payer: Medicare Other

## 2021-09-08 NOTE — Progress Notes (Deleted)
° ° °  SUBJECTIVE:   CHIEF COMPLAINT / HPI:   "Discuss gout": ***. All medications include allopurinol 150 mg daily. Previously used colchicine.  PERTINENT  PMH / PSH: ***  OBJECTIVE:   There were no vitals taken for this visit. ***  General: NAD, pleasant, able to participate in exam Cardiac: RRR, no murmurs. Respiratory: CTAB, normal effort, No wheezes, rales or rhonchi MSK: ***  ASSESSMENT/PLAN:   No problem-specific Assessment & Plan notes found for this encounter.     Jackelyn Poling, DO  [facility]    {    This will disappear when note is signed, click to select method of visit    :1}

## 2021-09-25 ENCOUNTER — Other Ambulatory Visit: Payer: Self-pay

## 2021-09-25 ENCOUNTER — Ambulatory Visit: Payer: Medicare Other

## 2021-09-25 DIAGNOSIS — Z20822 Contact with and (suspected) exposure to covid-19: Secondary | ICD-10-CM

## 2021-09-25 NOTE — Progress Notes (Signed)
Patient called nurse line reporting exposure to covid on Sunday. Patient denies any symptoms of covid, however he was told to test "later in the week." Patient stated she has home tests, however does not know how to use them. Patient asked to come in for assistance.   Patient reports in nurse clinic for education on home covid testing. Patients kit came with 4 tests. Patient tested in office using his own kit. Negative. All materials needed to test were thoroughly demonstrated to patient. Patient feels confident he could test in the future at home.  Patient advised to test if symptoms become present. Patient advised to contact me if he feels he can not test at home by himself.  Patient was very [employer].

## 2021-09-29 ENCOUNTER — Other Ambulatory Visit: Payer: Self-pay

## 2021-09-29 DIAGNOSIS — R059 Cough, unspecified: Secondary | ICD-10-CM

## 2021-09-29 MED ORDER — BENZONATATE 200 MG PO CAPS: 200.0000 mg | ORAL_CAPSULE | Freq: Three times a day (TID) | ORAL | 0 refills | Status: DC | PRN

## 2021-09-29 NOTE — Telephone Encounter (Signed)
Patient calls nurse line requesting rx refill on tessalon capsules. Patient has had cough for the last five days, that has not improved with OTC remedies.   Patient reports that tessalon capsules have worked well in the past.   Patient recently tested negative for COVID with home test on 09/25/21.  Please advise.   Veronda Prude, RN

## 2021-10-20 ENCOUNTER — Ambulatory Visit: Payer: Medicaid Other | Admitting: Podiatry

## 2021-10-20 NOTE — Progress Notes (Deleted)
   Complete physical exam  Patient: Seth Sanders   DOB: 07/11/1999   71 y.o. Male  MRN: [id number]  Subjective:    No chief complaint on file.   Seth Sanders is a 71 y.o. male who presents today for a complete physical exam. She reports consuming a {diet types:17450} diet. {types:19826} She generally feels {DESC; WELL/FAIRLY WELL/POORLY:18703}. She reports sleeping {DESC; WELL/FAIRLY WELL/POORLY:18703}. She {does/does not:200015} have additional problems to discuss today.    Most recent fall risk assessment:    03/18/2022   10:42 AM  Fall Risk   Falls in the past year? 0  Number falls in past yr: 0  Injury with Fall? 0  Risk for fall due to : No Fall Risks  Follow up Falls evaluation completed     Most recent depression screenings:    03/18/2022   10:42 AM 02/06/2021   10:46 AM  PHQ 2/9 Scores  PHQ - 2 Score 0 0  PHQ- 9 Score 5     {VISON DENTAL STD PSA (Optional):27386}  {History (Optional):23778}  Patient Care Team: Jessup, Joy, NP as PCP - General (Nurse Practitioner)   Outpatient Medications Prior to Visit  Medication Sig   fluticasone (FLONASE) 50 MCG/ACT nasal spray Place 2 sprays into both nostrils in the morning and at bedtime. After 7 days, reduce to once daily.   norgestimate-ethinyl estradiol (SPRINTEC 28) 0.25-35 MG-MCG tablet Take 1 tablet by mouth daily.   Nystatin POWD Apply liberally to affected area 2 times per day   spironolactone (ALDACTONE) 100 MG tablet Take 1 tablet (100 mg total) by mouth daily.   No facility-administered medications prior to visit.    ROS        Objective:     There were no vitals taken for this visit. {Vitals History (Optional):23777}  Physical Exam   No results found for any visits on 04/23/22. {Show previous labs (optional):23779}    Assessment & Plan:    Routine Health Maintenance and Physical Exam  Immunization History  Administered Date(s) Administered   DTaP 09/24/1999, 11/20/1999,  01/29/2000, 10/14/2000, 04/29/2004   Hepatitis A 02/24/2008, 03/01/2009   Hepatitis B 07/12/1999, 08/19/1999, 01/29/2000   HiB (PRP-OMP) 09/24/1999, 11/20/1999, 01/29/2000, 10/14/2000   IPV 09/24/1999, 11/20/1999, 07/19/2000, 04/29/2004   Influenza,inj,Quad PF,6+ Mos 06/01/2014   Influenza-Unspecified 08/31/2012   MMR 07/19/2001, 04/29/2004   Meningococcal Polysaccharide 02/29/2012   Pneumococcal Conjugate-13 10/14/2000   Pneumococcal-Unspecified 01/29/2000, 04/13/2000   Tdap 02/29/2012   Varicella 07/19/2000, 02/24/2008    Health Maintenance  Topic Date Due   HIV Screening  Never done   Hepatitis C Screening  Never done   INFLUENZA VACCINE  04/21/2022   PAP-Cervical Cytology Screening  04/23/2022 (Originally 07/10/2020)   PAP SMEAR-Modifier  04/23/2022 (Originally 07/10/2020)   TETANUS/TDAP  04/23/2022 (Originally 02/28/2022)   HPV VACCINES  Discontinued   COVID-19 Vaccine  Discontinued    Discussed health benefits of physical activity, and encouraged her to engage in regular exercise appropriate for her age and condition.  Problem List Items Addressed This Visit   None Visit Diagnoses     Annual physical exam    -  Primary   Cervical cancer screening       Need for Tdap vaccination          No follow-ups on file.     Joy Jessup, NP   

## 2021-10-22 ENCOUNTER — Encounter: Payer: Self-pay | Admitting: Podiatry

## 2021-10-22 ENCOUNTER — Ambulatory Visit (INDEPENDENT_AMBULATORY_CARE_PROVIDER_SITE_OTHER): Payer: Medicare Other | Admitting: Podiatry

## 2021-10-22 ENCOUNTER — Other Ambulatory Visit: Payer: Self-pay

## 2021-10-22 DIAGNOSIS — E118 Type 2 diabetes mellitus with unspecified complications: Secondary | ICD-10-CM

## 2021-10-22 DIAGNOSIS — M2011 Hallux valgus (acquired), right foot: Secondary | ICD-10-CM | POA: Diagnosis not present

## 2021-10-22 DIAGNOSIS — M2041 Other hammer toe(s) (acquired), right foot: Secondary | ICD-10-CM

## 2021-10-22 DIAGNOSIS — M2042 Other hammer toe(s) (acquired), left foot: Secondary | ICD-10-CM | POA: Diagnosis not present

## 2021-10-22 DIAGNOSIS — M79674 Pain in right toe(s): Secondary | ICD-10-CM

## 2021-10-22 DIAGNOSIS — E119 Type 2 diabetes mellitus without complications: Secondary | ICD-10-CM | POA: Diagnosis not present

## 2021-10-22 DIAGNOSIS — S91114A Laceration without foreign body of right lesser toe(s) without damage to nail, initial encounter: Secondary | ICD-10-CM | POA: Diagnosis not present

## 2021-10-22 DIAGNOSIS — B351 Tinea unguium: Secondary | ICD-10-CM

## 2021-10-22 DIAGNOSIS — M2012 Hallux valgus (acquired), left foot: Secondary | ICD-10-CM

## 2021-10-22 DIAGNOSIS — M79675 Pain in left toe(s): Secondary | ICD-10-CM

## 2021-10-22 NOTE — Progress Notes (Signed)
ANNUAL DIABETIC FOOT EXAM  Subjective: Seth Sanders presents today for for diabetic foot evaluation, for annual diabetic foot examination, and painful elongated mycotic toenails 1-5 bilaterally which are tender when wearing enclosed shoe gear. Pain is relieved with periodic professional debridement..  Patient relates 5 year h/o diabetes.  Patient denies any prior h/o foot wounds.  Patient denies any numbness, tingling, burning, or pins/needle sensation in feet.  Patient's blood sugar was 101 mg/dl two days ago. Patient did not check blood glucose this morning.  Risk factors: diabetes, current tobacco user, hyperlipidemia, HTN, CHF.  Lattie Haw, MD is patient's PCP. Last visit was 04/23/2021  Patient states he stubbed his right 2nd toe last night. He relates tenderness. There is blood on the distal tip of the digit, but he states it did not bleed after the injury.  .  Past Medical History:  Diagnosis Date   CHF (congestive heart failure) ([place])    Gout    Heart murmur    Hyperlipidemia    Hypertension    Patient Active Problem List   Diagnosis Date Noted   Type 2 diabetes mellitus with hyperglycemia ([place]) 11/21/2020   Hypercalcemia 11/21/2020   Cough 09/16/2020   Symptomatic bradycardia 06/24/2018   Overgrown toenails 06/24/2018   At risk for obstructive sleep apnea 11/08/2017   History of anemia 11/08/2017   Tobacco abuse 11/08/2017   Healthcare maintenance 09/29/2016   History of GI bleed 75/17/0017   Systolic murmur 49/44/9675   Low back pain 03/22/2014   Gout 01/26/2013   Severe obesity (BMI >= 40) ([place]) 09/08/2012   Type 2 diabetes mellitus with complication, without long-term current use of insulin ([place]) 09/08/2012   Hyperlipidemia 91/63/8466   Diastolic CHF ([place]) 59/93/5701   Essential hypertension 04/10/2009   Past Surgical History:  Procedure Laterality Date   NO PAST SURGERIES     Current Outpatient Medications on File Prior to Visit  Medication Sig  Dispense Refill   ACCU-CHEK AVIVA PLUS test strip CHECK BLOOD SUGAR TWICE DAILY 100 each 3   acetaminophen (TYLENOL 8 HOUR) 650 MG CR tablet Take 1 tablet (650 mg total) by mouth every 8 (eight) hours as needed for pain. 90 tablet 1   allopurinol (ZYLOPRIM) 300 MG tablet Take 0.5 tablets (150 mg total) by mouth daily. 90 tablet 3   amLODipine (NORVASC) 10 MG tablet TAKE 1 TABLET BY MOUTH DAILY 90 tablet 1   atorvastatin (LIPITOR) 40 MG tablet TAKE 1 TABLET(40 MG) BY MOUTH DAILY 30 tablet 11   benzonatate (TESSALON) 200 MG capsule Take 1 capsule (200 mg total) by mouth 3 (three) times daily as needed for cough. 30 capsule 0   Blood Glucose Monitoring Suppl (ACCU-CHEK AVIVA PLUS) w/Device KIT To check blood sugars twice daily. 1 kit 0   furosemide (LASIX) 20 MG tablet TAKE 1 TABLET(20 MG) BY MOUTH TWICE DAILY AS NEEDED 180 tablet 3   gabapentin (NEURONTIN) 300 MG capsule TAKE 1 CAPSULE(300 MG) BY MOUTH THREE TIMES DAILY AS NEEDED 90 capsule 1   indapamide (LOZOL) 2.5 MG tablet TAKE 1 TABLET(2.5 MG) BY MOUTH DAILY 90 tablet 1   Lido-Capsaicin-Men-Methyl Sal (1ST MEDX-PATCH/ LIDOCAINE) 4-0.025-5-20 % PTCH Apply to lower back daily as needed 10 patch 1   lisinopril (ZESTRIL) 40 MG tablet TAKE 1 TABLET BY MOUTH EVERY DAY IN THE MORNING 90 tablet 3   metFORMIN (GLUCOPHAGE) 1000 MG tablet TAKE 1 TABLET(1000 MG) BY MOUTH TWICE DAILY WITH A MEAL 90 tablet 3   spironolactone (ALDACTONE)  25 MG tablet TAKE 1 TABLET(25 MG) BY MOUTH DAILY 30 tablet 11   traMADol (ULTRAM) 50 MG tablet TAKE 1 TABLET(50 MG) BY MOUTH EVERY 12 HOURS AS NEEDED 14 tablet 0   No current facility-administered medications on file prior to visit.    Allergies  Allergen Reactions   Carvedilol     Symptomatic bradycardia to the 40s   Social History   Occupational History    Employer: [employer]  Tobacco Use   Smoking status: Every Day    Packs/day: 0.50    Years: 35.00    Pack years: 17.50    Types: Cigarettes   Smokeless  tobacco: Never   Tobacco comments:     Not interested now in quitting/ls  Vaping Use   Vaping Use: Never used  Substance and Sexual Activity   Alcohol use: No   Drug use: No   Sexual activity: Not on file   Family History  Problem Relation Age of Onset   Heart attack Mother    Hypertension Mother    Immunization History  Administered Date(s) Administered   Moderna Sars-Covid-2 Vaccination 05/25/2020, 06/22/2020   Pneumococcal Conjugate-13 11/08/2017     Review of Systems: Negative except as noted in the HPI.   Objective: There were no vitals filed for this visit.  Seth Sanders is a pleasant 71 y.o. male in NAD. AAO X 3.  Vascular Examination: CFT immediate b/l LE. Palpable DP/PT pulses b/l LE. Digital hair absent b/l. Skin temperature gradient WNL b/l. No pain with calf compression b/l. No edema noted b/l. No cyanosis or clubbing noted b/l LE.  Dermatological Examination: Pedal integument with normal turgor, texture and tone BLE. Transverse laceration at proximal nail border right 2nd digit. Evidence of fresh dried heme. No active bleeding. Consistent with patient's history of trauma incurred on last evening. No erythema, no edema, no drainage, no fluctuance. No interdigital macerations noted b/l LE. Severely overgrown, onychogryphotic toenails 1-5 b/l. No hyperkeratotic nor porokeratotic lesions present on today's visit.  Musculoskeletal Examination: Normal muscle strength 5/5 to all lower extremity muscle groups bilaterally. HAV with bunion deformity noted b/l LE. Hammertoe deformity noted 2-5 b/l. Pes planus deformity noted bilateral LE.Marland Kitchen No pain, crepitus or joint limitation noted with ROM b/l LE.  Patient ambulates independently without assistive aids.  Footwear Assessment: Does the patient wear appropriate shoes? Yes. Does the patient need inserts/orthotics? Yes.  Neurological Examination: Protective sensation intact 5/5 intact bilaterally with 10g monofilament b/l.  Vibratory sensation intact b/l. Deep tendon reflexes normal b/l.  Clonus negative b/l.  Hemoglobin A1C Latest Ref Rng & Units 04/23/2021 11/20/2020  HGBA1C 0.0 - 7.0 % 7.9(A) 9.4(A)  Some recent data might be hidden   Assessment: 1. Pain due to onychomycosis of toenails of both feet   2. Laceration of second toe of right foot, initial encounter   3. Hallux valgus, acquired, bilateral   4. Acquired hammertoes of both feet   5. Type 2 diabetes mellitus with complication, without long-term current use of insulin ([place])   6. Encounter for diabetic foot exam ([place])     ADA Risk Categorization: Low Risk :  Patient has all of the following: Intact protective sensation No prior foot ulcer  No severe deformity Pedal pulses present  Plan: -Diabetic foot examination performed today. -Continue foot and shoe inspections daily. Monitor blood glucose per PCP/Endocrinologist's recommendations. -Mycotic toenails 1-5 bilaterally were debrided in length and girth with sterile nail nippers and dremel without incident. -For laceration right  2nd digit was cleansed with alcohol. Triple antibiotic ointment and a band-aid was applied. He was instructed to apply Neosporin to digit once daily until healed. Call office if condition worsens or he has any questions/concerns. -Patient/POA to call should there be question/concern in the interim.  Return in about 3 months (around 01/19/2022) for diabetic nail trim.  Marzetta Board, DPM

## 2021-10-22 NOTE — Patient Instructions (Signed)
Apply Neosporin to right 2nd toe once daily until healed. Call office if condition worsens or you have any concerns.

## 2021-11-10 ENCOUNTER — Ambulatory Visit (INDEPENDENT_AMBULATORY_CARE_PROVIDER_SITE_OTHER): Payer: Medicare Other | Admitting: Family Medicine

## 2021-11-10 ENCOUNTER — Other Ambulatory Visit: Payer: Self-pay

## 2021-11-10 VITALS — BP 141/58 | HR 74 | Ht 72.0 in | Wt 310.5 lb

## 2021-11-10 DIAGNOSIS — E118 Type 2 diabetes mellitus with unspecified complications: Secondary | ICD-10-CM | POA: Diagnosis not present

## 2021-11-10 DIAGNOSIS — E782 Mixed hyperlipidemia: Secondary | ICD-10-CM | POA: Diagnosis not present

## 2021-11-10 DIAGNOSIS — M545 Low back pain, unspecified: Secondary | ICD-10-CM

## 2021-11-10 DIAGNOSIS — M25531 Pain in right wrist: Secondary | ICD-10-CM

## 2021-11-10 DIAGNOSIS — G8929 Other chronic pain: Secondary | ICD-10-CM

## 2021-11-10 LAB — POCT GLYCOSYLATED HEMOGLOBIN (HGB A1C): HbA1c, POC (controlled diabetic range): 7.6 % — AB (ref 0.0–7.0)

## 2021-11-10 MED ORDER — DICLOFENAC SODIUM 1 % EX GEL: 4.0000 g | Freq: Four times a day (QID) | CUTANEOUS | 0 refills | Status: DC

## 2021-11-10 MED ORDER — TRAMADOL HCL 50 MG PO TABS: ORAL_TABLET | ORAL | 0 refills | Status: DC

## 2021-11-10 NOTE — Progress Notes (Addendum)
° ° ° °  SUBJECTIVE:   CHIEF COMPLAINT / HPI:   Seth Sanders is a 71 y.o. male presents for diabetes follow up   Diabetes Patient's current diabetic medications include metformin. Tolerating well without side effects.  Patient endorses compliance with these medications. Does not check CBGs at home. Patient's last A1c was  Lab Results  Component Value Date   HGBA1C 7.6 (A) 11/10/2021   HGBA1C 7.9 (A) 04/23/2021   HGBA1C 9.4 (A) 11/20/2020    Denies abdominal pain, blurred vision, polyuria, polydipsia, hypoglycemia. Patient states they understand that diet and exercise can help with her diabetes.    Last Microalbumin, LDL, Creatinine: Lab Results  Component Value Date   MICROALBUR 3.68 (H) 08/13/2010   LDLCALC 59 11/10/2021   CREATININE 0.93 11/10/2021    Tramadol refill Pt would like small amount of tramadol for back pain and OA. Last refill was last August. He was unable to get refill as I was out on maternity leave. 14 tablets lasted him several months.    Right wrist pain Pt was getting out of bed and he put pressure on right wrist 1 month and since then it has been hurting. He has tried biofreeze, icey hot but the pain is still there. Niece put him in a splint.  Flowsheet Row Office Visit from 11/10/2021 in [facility]  PHQ-9 Total Score 15        Health Maintenance Due  Topic   Zoster Vaccines- Shingrix (1 of 2)   COLONOSCOPY (Pts 45-93yrs Insurance coverage will need to be confirmed)    OPHTHALMOLOGY EXAM    Pneumonia Vaccine 1+ Years old (2 - PPSV23 if available, else PCV20)     PERTINENT  PMH / PSH:   OBJECTIVE:   BP (!) 141/58    Pulse 74    Ht 6' (1.829 m)    Wt (!) 310 lb 8 oz (140.8 kg)    SpO2 100%    BMI 42.11 kg/m    General: Alert, no acute distress Cardio: Normal S1 and S2, RRR, no r/m/g Pulm: CTAB, normal work of breathing Abdomen: Bowel sounds normal. Abdomen soft and non-tender.  Extremities: No peripheral edema.  Neuro:  Cranial nerves grossly intact   Right wrist: Inspection: No obvious deformity. Generalized right wrist swelling Palpation: TTP ROM: limited ROM at right wrist  Strength: 5/5 strength in the forearm, wrist and interosseus muscles Neurovascular: NV intact Special tests: negative tinel's at the carpal tunnel, negative Phalen's and reverse Phalen's    ASSESSMENT/PLAN:   Type 2 diabetes mellitus with complication, without long-term current use of insulin (HCC) A1c 7.6 today, excellent control. Continue metformin. F/u in 3 months.  Hyperlipidemia Obtained lipid panel. Continue atorvastatin 40mg .  Right wrist pain Soft tissue injury VS fracture following injury. Ordered right wrist xray. Recommended he continues to wear splint until there. Recommended tylenol and diclofenac to the area. Follow up with me for results of the results.  Low back pain Refilled tramadol x14 tablets. PDMP reviewed. No further refills>3 months.    , MD PGY-3 [facility]

## 2021-11-10 NOTE — Patient Instructions (Signed)
Thank you for coming to see me today. It was a pleasure. Today we discussed your right wrist pain, it could be a fracture. I recommend RIGHT WRIST XRAY  I have placed an order for RIGHT WRIST XRAY.  Please go to [facility] at [facility] or at [facility] to have this completed.  You do not need an appointment, but if you would like to call them beforehand, their number is [phone].  We will contact you with your results afterwards.   Keep the wrist in a splint and apply dicoflenac gel to the area.   Diabetes control is very good. congrats  Refilled tramadol 14 tablets. I will refill only after 4 months, no sooner.  Please follow-up with me in 4 days  If you have any questions or concerns, please do not hesitate to call the office at [phone].  Best wishes,   Dr Allena Katz

## 2021-11-11 LAB — LIPID PANEL
Chol/HDL Ratio: 2.5 ratio (ref 0.0–5.0)
Cholesterol, Total: 132 mg/dL (ref 100–199)
HDL: 53 mg/dL (ref >39)
LDL Chol Calc (NIH): 59 mg/dL (ref 0–99)
Triglycerides: 107 mg/dL (ref 0–149)
VLDL Cholesterol Cal: 20 mg/dL (ref 5–40)

## 2021-11-11 LAB — BASIC METABOLIC PANEL
BUN/Creatinine Ratio: 15 (ref 10–24)
BUN: 14 mg/dL (ref 8–27)
CO2: 22 mmol/L (ref 20–29)
Calcium: 10.2 mg/dL (ref 8.6–10.2)
Chloride: 100 mmol/L (ref 96–106)
Creatinine, Ser: 0.93 mg/dL (ref 0.76–1.27)
Glucose: 157 mg/dL — ABNORMAL HIGH (ref 70–99)
Potassium: 4.2 mmol/L (ref 3.5–5.2)
Sodium: 140 mmol/L (ref 134–144)
eGFR: 88 mL/min/{1.73_m2} (ref >59)

## 2021-11-14 ENCOUNTER — Ambulatory Visit: Payer: Medicare Other | Admitting: Family Medicine

## 2021-11-17 ENCOUNTER — Other Ambulatory Visit: Payer: Self-pay | Admitting: Family Medicine

## 2021-11-17 DIAGNOSIS — M25531 Pain in right wrist: Secondary | ICD-10-CM | POA: Insufficient documentation

## 2021-11-17 NOTE — Assessment & Plan Note (Signed)
Refilled tramadol x14 tablets. PDMP reviewed. No further refills>3 months.

## 2021-11-17 NOTE — Assessment & Plan Note (Addendum)
A1c 7.6 today, excellent control. Continue metformin. F/u in 3 months.

## 2021-11-17 NOTE — Assessment & Plan Note (Signed)
Soft tissue injury VS fracture following injury. Ordered right wrist xray. Recommended he continues to wear splint until there. Recommended tylenol and diclofenac to the area. Follow up with me for results of the results.

## 2021-11-17 NOTE — Assessment & Plan Note (Signed)
Obtained lipid panel. Continue atorvastatin 40mg .

## 2022-01-01 ENCOUNTER — Other Ambulatory Visit: Payer: Self-pay | Admitting: Family Medicine

## 2022-01-01 DIAGNOSIS — I10 Essential (primary) hypertension: Secondary | ICD-10-CM

## 2022-01-16 ENCOUNTER — Other Ambulatory Visit: Payer: Self-pay | Admitting: Family Medicine

## 2022-01-16 DIAGNOSIS — I10 Essential (primary) hypertension: Secondary | ICD-10-CM

## 2022-01-21 ENCOUNTER — Ambulatory Visit (INDEPENDENT_AMBULATORY_CARE_PROVIDER_SITE_OTHER): Payer: Self-pay | Admitting: Podiatry

## 2022-01-21 DIAGNOSIS — Z91199 Patient's noncompliance with other medical treatment and regimen due to unspecified reason: Secondary | ICD-10-CM

## 2022-01-21 NOTE — Progress Notes (Signed)
   Complete physical exam  Patient: Seth Sanders   DOB: 07/11/1999   71 y.o. Male  MRN: [id number]  Subjective:    No chief complaint on file.   Seth Sanders is a 71 y.o. male who presents today for a complete physical exam. She reports consuming a {diet types:17450} diet. {types:19826} She generally feels {DESC; WELL/FAIRLY WELL/POORLY:18703}. She reports sleeping {DESC; WELL/FAIRLY WELL/POORLY:18703}. She {does/does not:200015} have additional problems to discuss today.    Most recent fall risk assessment:    03/18/2022   10:42 AM  Fall Risk   Falls in the past year? 0  Number falls in past yr: 0  Injury with Fall? 0  Risk for fall due to : No Fall Risks  Follow up Falls evaluation completed     Most recent depression screenings:    03/18/2022   10:42 AM 02/06/2021   10:46 AM  PHQ 2/9 Scores  PHQ - 2 Score 0 0  PHQ- 9 Score 5     {VISON DENTAL STD PSA (Optional):27386}  {History (Optional):23778}  Patient Care Team: Jessup, Joy, NP as PCP - General (Nurse Practitioner)   Outpatient Medications Prior to Visit  Medication Sig   fluticasone (FLONASE) 50 MCG/ACT nasal spray Place 2 sprays into both nostrils in the morning and at bedtime. After 7 days, reduce to once daily.   norgestimate-ethinyl estradiol (SPRINTEC 28) 0.25-35 MG-MCG tablet Take 1 tablet by mouth daily.   Nystatin POWD Apply liberally to affected area 2 times per day   spironolactone (ALDACTONE) 100 MG tablet Take 1 tablet (100 mg total) by mouth daily.   No facility-administered medications prior to visit.    ROS        Objective:     There were no vitals taken for this visit. {Vitals History (Optional):23777}  Physical Exam   No results found for any visits on 04/23/22. {Show previous labs (optional):23779}    Assessment & Plan:    Routine Health Maintenance and Physical Exam  Immunization History  Administered Date(s) Administered   DTaP 09/24/1999, 11/20/1999,  01/29/2000, 10/14/2000, 04/29/2004   Hepatitis A 02/24/2008, 03/01/2009   Hepatitis B 07/12/1999, 08/19/1999, 01/29/2000   HiB (PRP-OMP) 09/24/1999, 11/20/1999, 01/29/2000, 10/14/2000   IPV 09/24/1999, 11/20/1999, 07/19/2000, 04/29/2004   Influenza,inj,Quad PF,6+ Mos 06/01/2014   Influenza-Unspecified 08/31/2012   MMR 07/19/2001, 04/29/2004   Meningococcal Polysaccharide 02/29/2012   Pneumococcal Conjugate-13 10/14/2000   Pneumococcal-Unspecified 01/29/2000, 04/13/2000   Tdap 02/29/2012   Varicella 07/19/2000, 02/24/2008    Health Maintenance  Topic Date Due   HIV Screening  Never done   Hepatitis C Screening  Never done   INFLUENZA VACCINE  04/21/2022   PAP-Cervical Cytology Screening  04/23/2022 (Originally 07/10/2020)   PAP SMEAR-Modifier  04/23/2022 (Originally 07/10/2020)   TETANUS/TDAP  04/23/2022 (Originally 02/28/2022)   HPV VACCINES  Discontinued   COVID-19 Vaccine  Discontinued    Discussed health benefits of physical activity, and encouraged her to engage in regular exercise appropriate for her age and condition.  Problem List Items Addressed This Visit   None Visit Diagnoses     Annual physical exam    -  Primary   Cervical cancer screening       Need for Tdap vaccination          No follow-ups on file.     Joy Jessup, NP   

## 2022-02-04 ENCOUNTER — Other Ambulatory Visit: Payer: Self-pay

## 2022-02-05 MED ORDER — SPIRONOLACTONE 25 MG PO TABS: ORAL_TABLET | ORAL | 0 refills | Status: DC

## 2022-04-10 ENCOUNTER — Other Ambulatory Visit: Payer: Self-pay | Admitting: Family Medicine

## 2022-04-10 DIAGNOSIS — I10 Essential (primary) hypertension: Secondary | ICD-10-CM

## 2022-04-16 ENCOUNTER — Other Ambulatory Visit: Payer: Self-pay | Admitting: *Deleted

## 2022-04-16 DIAGNOSIS — E118 Type 2 diabetes mellitus with unspecified complications: Secondary | ICD-10-CM

## 2022-04-16 NOTE — Telephone Encounter (Signed)
Patient called and needs refills on his test strips and lancets.  Will forward to MD.  [name]

## 2022-04-17 MED ORDER — ACCU-CHEK SOFTCLIX LANCETS MISC: 12 refills | Status: DC

## 2022-04-17 MED ORDER — ACCU-CHEK AVIVA PLUS VI STRP: ORAL_STRIP | 3 refills | Status: DC

## 2022-04-28 ENCOUNTER — Other Ambulatory Visit: Payer: Self-pay | Admitting: *Deleted

## 2022-04-28 MED ORDER — ALLOPURINOL 300 MG PO TABS: 150.0000 mg | ORAL_TABLET | Freq: Every day | ORAL | 3 refills | Status: DC

## 2022-06-03 ENCOUNTER — Other Ambulatory Visit: Payer: Self-pay | Admitting: Family Medicine

## 2022-06-03 ENCOUNTER — Other Ambulatory Visit: Payer: Self-pay | Admitting: *Deleted

## 2022-06-03 DIAGNOSIS — E118 Type 2 diabetes mellitus with unspecified complications: Secondary | ICD-10-CM

## 2022-06-03 DIAGNOSIS — I10 Essential (primary) hypertension: Secondary | ICD-10-CM

## 2022-06-03 DIAGNOSIS — I5032 Chronic diastolic (congestive) heart failure: Secondary | ICD-10-CM

## 2022-06-03 MED ORDER — FUROSEMIDE 20 MG PO TABS: ORAL_TABLET | ORAL | 3 refills | Status: DC

## 2022-06-03 NOTE — Progress Notes (Signed)
Patient requested refill of lasix. Due for labs to check potassium and creatinine. Admins to call and make appointment.   Ordered BMP, A1c, and urine protein:creatinine.   Fayette Pho, MD

## 2022-06-08 ENCOUNTER — Other Ambulatory Visit: Payer: Self-pay

## 2022-06-09 MED ORDER — METFORMIN HCL 1000 MG PO TABS: 1000.0000 mg | ORAL_TABLET | Freq: Two times a day (BID) | ORAL | 3 refills | Status: DC

## 2022-06-09 MED ORDER — SPIRONOLACTONE 25 MG PO TABS: ORAL_TABLET | ORAL | 0 refills | Status: DC

## 2022-06-11 ENCOUNTER — Other Ambulatory Visit: Payer: Self-pay

## 2022-06-11 DIAGNOSIS — E118 Type 2 diabetes mellitus with unspecified complications: Secondary | ICD-10-CM

## 2022-06-11 MED ORDER — ACCU-CHEK SOFTCLIX LANCETS MISC: 12 refills | Status: DC

## 2022-06-11 MED ORDER — ACCU-CHEK AVIVA PLUS VI STRP: ORAL_STRIP | 3 refills | Status: DC

## 2022-06-25 ENCOUNTER — Ambulatory Visit: Payer: Medicare Other | Admitting: Family Medicine

## 2022-06-25 NOTE — Progress Notes (Deleted)
    SUBJECTIVE:   CHIEF COMPLAINT / HPI:   [facility] Feb 23 Type 2 diabetes mellitus with complication, without long-term current use of insulin (HCC) A1c 7.6 today, excellent control. Continue metformin. F/u in 3 months.   Hyperlipidemia Obtained lipid panel. Continue atorvastatin 40mg .   Right wrist pain Soft tissue injury VS fracture following injury. Ordered right wrist xray. Recommended he continues to wear splint until there. Recommended tylenol and diclofenac to the area. Follow up with me for results of the results.   Low back pain Refilled tramadol x14 tablets. PDMP reviewed. No further refills>3 months.    PERTINENT  PMH / PSH: ***  OBJECTIVE:   There were no vitals taken for this visit.  ***  ASSESSMENT/PLAN:   No problem-specific Assessment & Plan notes found for this encounter.     Ezequiel Essex, MD [place]

## 2022-07-09 ENCOUNTER — Other Ambulatory Visit: Payer: Self-pay | Admitting: Family Medicine

## 2022-07-09 MED ORDER — ATORVASTATIN CALCIUM 40 MG PO TABS: 40.0000 mg | ORAL_TABLET | Freq: Every day | ORAL | 3 refills | Status: DC

## 2022-07-30 ENCOUNTER — Other Ambulatory Visit: Payer: Self-pay

## 2022-07-30 MED ORDER — METFORMIN HCL 1000 MG PO TABS: 1000.0000 mg | ORAL_TABLET | Freq: Two times a day (BID) | ORAL | 3 refills | Status: DC

## 2022-07-30 NOTE — Telephone Encounter (Signed)
Pharmacy requesting 90 day supply per refill. Sunday Spillers, CMA

## 2022-09-03 ENCOUNTER — Other Ambulatory Visit: Payer: Self-pay | Admitting: Family Medicine

## 2022-09-16 NOTE — Patient Instructions (Addendum)
It was wonderful to see you today. Thank you for allowing me to be a part of your care. Below is a short summary of what we discussed at your visit today:  Diabetes Today your A1c was 7.4, even better than before! We collected a urine sample to make sure your kidneys are not being damaged by your diabetes.   You can check your blood sugars ONLY when you think your sugar is too low or too high. You do not need to check routinely.   Blood pressure Excellent today. No changes. We will check your blood and make sure these medications are not affecting your kidneys or potassium.   Vaccines Today you declined the annual flu vaccine and COVID vaccine.  If you change your mind at any time, you may simply call the clinic and make a vaccine only appointment with a nurse at your convenience.  We recommend completing the two-shot COVID vaccine once and also getting your flu shot every year.  While it does not fully prevent you from getting the flu or COVID, it does reduce symptom severity and keep you out of the hospital.  Colon cancer screening I have ordered the Cologuard screening. It will be mailed to your house.   Medicare Annual Wellness We will call you to book your yearly annual wellness visit.    Please bring all of your medications to every appointment!  If you have any questions or concerns, please do not hesitate to contact us via phone or MyChart message.   Fayette Pho, MD

## 2022-09-16 NOTE — Progress Notes (Signed)
SUBJECTIVE:   CHIEF COMPLAINT / HPI:   T2DM Current regimen includes metformin 1000 mg twice daily.  Lisinopril 40 mg for renal protection, atorvastatin 40 mg for lipid control. Tolerating meds well without side effects. Denies diarrhea and abdominal pain.  Lab Results  Component Value Date   HGBA1C 7.4 (A) 09/25/2022   HGBA1C 7.6 (A) 11/10/2021   HGBA1C 7.9 (A) 04/23/2021   Lab Results  Component Value Date   MICROALBUR 3.68 (H) 08/13/2010   LDLCALC 59 11/10/2021   CREATININE 0.82 09/25/2022   HTN Current regimen includes amlodipine 10 mg, lisinopril 40 mg, spironolactone 25 mg. Tolerating meds well without side effects. Denies orthostatic hypotension or dizziness.  BP Readings from Last 3 Encounters:  09/25/22 130/68  11/10/21 (!) 141/58  04/23/21 (!) 163/57   Health maintenance - colonoscopy - ophtho for T2DM - shingles, COVID, flu vaccines  Back pain Patient reports ongoing bilateral lumbar back pain. No known prior injury or inciting event. Back pain came on gradually. Really started bothering him in his late 71s, he thought because of lifetime of lifting heavy items.   Previously on tramadol PRN for back pain, can also take tylenol with some relief.   Reports he needs to get back to work, he's in a bad financial situation. However, he is concerned because he can only stand for 10 minutes at a time now before needing to stop and bend over or sit down due to back pain.   No leg weakness or incontinence, no saddle anesthesia.  PERTINENT  PMH / PSH: T2DM, HLD, CHF, HTN, history of GI bleed and anemia, gout  OBJECTIVE:   BP 130/68   Pulse 93   Ht 6' (1.829 m)   Wt (!) 307 lb (139.3 kg)   SpO2 100%   BMI 41.64 kg/m    PHQ-9:     09/25/2022    1:31 PM 11/10/2021    8:33 AM 04/23/2021    2:20 PM  Depression screen PHQ 2/9  Decreased Interest 3 3 3   Down, Depressed, Hopeless 3 3 3   PHQ - 2 Score 6 6 6   Altered sleeping 0 3 3  Tired, decreased energy 3 3 3    Change in appetite 0 0 0  Feeling bad or failure about yourself  0 3 1  Trouble concentrating 0 0 3  Moving slowly or fidgety/restless 0 0 0  Suicidal thoughts 0 0 0  PHQ-9 Score 9 15 16   Difficult doing work/chores Not difficult at all  Somewhat difficult    Physical Exam General: Awake, alert, oriented Cardiovascular: Regular rate and rhythm, S1 and S2 present, no murmurs auscultated Respiratory: Lung fields clear to auscultation bilaterally  ASSESSMENT/PLAN:   Type 2 diabetes mellitus with complication, without long-term current use of insulin (HCC) A1c 7.6 today, within goal. Tolerating meds well with no s/e. No changes at this time. Next check 6 months or so.  Essential hypertension BP at goal today. Taking amlodipine, lisinopril, and spironolactone. Tolerating well without side effects. Will continue these meds.  - BMP today  Healthcare maintenance - referred to ophtho for diabetic eye exam - patient declines colonoscopy, prefers cologuard, will order today - due for shingles, COVID, and flu vaccines   Low back pain Chronic low back pain, persistent and stable. Patient requesting tramadol today. No red flag s/s. Will provide small tramadol refill, discussed likely MSK etiology and multimodal treatment to include stretching, PT, and topicals. He does have lidocaine patches he uses. Declines PT  at this time. Will try muscle relaxer and provide "the back book".       Fayette Pho, MD [facility]

## 2022-09-25 ENCOUNTER — Ambulatory Visit (INDEPENDENT_AMBULATORY_CARE_PROVIDER_SITE_OTHER): Payer: Medicare Other | Admitting: Family Medicine

## 2022-09-25 ENCOUNTER — Encounter: Payer: Self-pay | Admitting: Family Medicine

## 2022-09-25 VITALS — BP 130/68 | HR 93 | Ht 72.0 in | Wt 307.0 lb

## 2022-09-25 DIAGNOSIS — Z1211 Encounter for screening for malignant neoplasm of colon: Secondary | ICD-10-CM | POA: Diagnosis not present

## 2022-09-25 DIAGNOSIS — M545 Low back pain, unspecified: Secondary | ICD-10-CM | POA: Diagnosis not present

## 2022-09-25 DIAGNOSIS — E118 Type 2 diabetes mellitus with unspecified complications: Secondary | ICD-10-CM | POA: Diagnosis not present

## 2022-09-25 DIAGNOSIS — Z0000 Encounter for general adult medical examination without abnormal findings: Secondary | ICD-10-CM

## 2022-09-25 DIAGNOSIS — G8929 Other chronic pain: Secondary | ICD-10-CM

## 2022-09-25 DIAGNOSIS — I5032 Chronic diastolic (congestive) heart failure: Secondary | ICD-10-CM

## 2022-09-25 DIAGNOSIS — R7989 Other specified abnormal findings of blood chemistry: Secondary | ICD-10-CM | POA: Diagnosis not present

## 2022-09-25 DIAGNOSIS — I10 Essential (primary) hypertension: Secondary | ICD-10-CM | POA: Diagnosis not present

## 2022-09-25 LAB — POCT GLYCOSYLATED HEMOGLOBIN (HGB A1C): HbA1c, POC (controlled diabetic range): 7.4 % — AB (ref 0.0–7.0)

## 2022-09-25 MED ORDER — AMLODIPINE BESYLATE 10 MG PO TABS: 10.0000 mg | ORAL_TABLET | Freq: Every day | ORAL | 1 refills | Status: DC

## 2022-09-25 MED ORDER — TRAMADOL HCL 50 MG PO TABS: ORAL_TABLET | ORAL | 0 refills | Status: DC

## 2022-09-25 MED ORDER — SPIRONOLACTONE 25 MG PO TABS: ORAL_TABLET | ORAL | 0 refills | Status: DC

## 2022-09-25 MED ORDER — FUROSEMIDE 20 MG PO TABS: ORAL_TABLET | ORAL | 1 refills | Status: DC

## 2022-09-25 MED ORDER — BACLOFEN 10 MG PO TABS: 10.0000 mg | ORAL_TABLET | Freq: Two times a day (BID) | ORAL | 0 refills | Status: DC | PRN

## 2022-09-25 MED ORDER — LISINOPRIL 40 MG PO TABS: 40.0000 mg | ORAL_TABLET | Freq: Every day | ORAL | 3 refills | Status: DC

## 2022-09-26 ENCOUNTER — Other Ambulatory Visit: Payer: Self-pay | Admitting: Family Medicine

## 2022-09-26 DIAGNOSIS — I10 Essential (primary) hypertension: Secondary | ICD-10-CM

## 2022-09-26 DIAGNOSIS — R7989 Other specified abnormal findings of blood chemistry: Secondary | ICD-10-CM

## 2022-09-26 NOTE — Assessment & Plan Note (Addendum)
BP at goal today. Taking amlodipine, lisinopril, and spironolactone. Tolerating well without side effects. Will continue these meds.  - BMP today

## 2022-09-26 NOTE — Assessment & Plan Note (Signed)
A1c 7.6 today, within goal. Tolerating meds well with no s/e. No changes at this time. Next check 6 months or so.

## 2022-09-26 NOTE — Progress Notes (Signed)
Add-on albumin to correct calcium.  Ezequiel Essex, MD

## 2022-09-26 NOTE — Assessment & Plan Note (Signed)
Chronic low back pain, persistent and stable. Patient requesting tramadol today. No red flag s/s. Will provide small tramadol refill, discussed likely MSK etiology and multimodal treatment to include stretching, PT, and topicals. He does have lidocaine patches he uses. Declines PT at this time. Will try muscle relaxer and provide "the back book".

## 2022-09-26 NOTE — Assessment & Plan Note (Signed)
-   referred to ophtho for diabetic eye exam - patient declines colonoscopy, prefers cologuard, will order today - due for shingles, COVID, and flu vaccines

## 2022-09-27 LAB — MICROALBUMIN / CREATININE URINE RATIO
Creatinine, Urine: 82.2 mg/dL
Microalb/Creat Ratio: 129 mg/g creat — ABNORMAL HIGH (ref 0–29)
Microalbumin, Urine: 106.2 ug/mL

## 2022-09-27 LAB — BASIC METABOLIC PANEL
BUN/Creatinine Ratio: 16 (ref 10–24)
BUN: 13 mg/dL (ref 8–27)
CO2: 24 mmol/L (ref 20–29)
Calcium: 10.4 mg/dL — ABNORMAL HIGH (ref 8.6–10.2)
Chloride: 101 mmol/L (ref 96–106)
Creatinine, Ser: 0.82 mg/dL (ref 0.76–1.27)
Glucose: 125 mg/dL — ABNORMAL HIGH (ref 70–99)
Potassium: 4.1 mmol/L (ref 3.5–5.2)
Sodium: 142 mmol/L (ref 134–144)
eGFR: 94 mL/min/{1.73_m2} (ref >59)

## 2022-09-28 ENCOUNTER — Encounter: Payer: Self-pay | Admitting: Family Medicine

## 2022-09-28 DIAGNOSIS — R809 Proteinuria, unspecified: Secondary | ICD-10-CM | POA: Insufficient documentation

## 2022-09-29 LAB — SPECIMEN STATUS REPORT

## 2022-09-29 LAB — ALBUMIN: Albumin: 4.5 g/dL (ref 3.8–4.8)

## 2022-10-20 ENCOUNTER — Telehealth (INDEPENDENT_AMBULATORY_CARE_PROVIDER_SITE_OTHER): Payer: 59 | Admitting: Family Medicine

## 2022-10-20 DIAGNOSIS — H5789 Other specified disorders of eye and adnexa: Secondary | ICD-10-CM | POA: Diagnosis not present

## 2022-10-20 NOTE — Progress Notes (Signed)
[place] Telemedicine Visit  Patient consented to have virtual visit and was identified by name and date of birth. Method of visit: Telephone  Encounter participants: Patient: Seth Sanders - located at home Provider: Alcus Dad - located at [facility]  Chief Complaint: "pink eye"  HPI: -left eye is red -started 4 days ago -neither worsening nor improving -watering some but no crusting or other drainage -associated discomfort which is mild and relieved with Tylenol -no fever, cough, congestion, sore throat, headache or other complaints -no swelling -no foreign body or trauma to the eye -has cataract in that eye but states his vision is unchanged from baseline -tried "clear eyes" eye drops  ROS: per HPI  Pertinent PMHx: HTN, T2DM  Exam:  There were no vitals taken for this visit.  Respiratory: speaking in full sentences, no respiratory distress Neuro: normal speech Psych: appropriate  Assessment/Plan:  Eye Redness Patient with 4 days of left eye redness and mild discomfort. No foreign body, vision changes, eye swelling, fever, or other symptoms. Unfortunately patient unable to figure out video for today's visit so I could not examine the eye but there are no red flags on history. Given my limited evaluation, main differential includes subconjunctival hemorrhage, viral conjunctivitis, hordeolum. Advised warm compresses and in-person evaluation if symptoms persist.  Time spent during visit with patient: 14 minutes

## 2022-10-30 ENCOUNTER — Ambulatory Visit: Payer: 59 | Admitting: Student

## 2022-11-01 NOTE — Progress Notes (Unsigned)
    The pt requested to reschedule his AWV appointment because he was not feeling well. He said he would give [place] a call back to reschedule.

## 2022-11-01 NOTE — Patient Instructions (Signed)
Health Maintenance, Male Adopting a healthy lifestyle and getting preventive care are important in promoting health and wellness. Ask your health care provider about: The right schedule for you to have regular tests and exams. Things you can do on your own to prevent diseases and keep yourself healthy. What should I know about diet, weight, and exercise? Eat a healthy diet  Eat a diet that includes plenty of vegetables, fruits, low-fat dairy products, and lean protein. Do not eat a lot of foods that are high in solid fats, added sugars, or sodium. Maintain a healthy weight Body mass index (BMI) is a measurement that can be used to identify possible weight problems. It estimates body fat based on height and weight. Your health care provider can help determine your BMI and help you achieve or maintain a healthy weight. Get regular exercise Get regular exercise. This is one of the most important things you can do for your health. Most adults should: Exercise for at least 150 minutes each week. The exercise should increase your heart rate and make you sweat (moderate-intensity exercise). Do strengthening exercises at least twice a week. This is in addition to the moderate-intensity exercise. Spend less time sitting. Even light physical activity can be beneficial. Watch cholesterol and blood lipids Have your blood tested for lipids and cholesterol at 72 years of age, then have this test every 5 years. You may need to have your cholesterol levels checked more often if: Your lipid or cholesterol levels are high. You are older than 72 years of age. You are at high risk for heart disease. What should I know about cancer screening? Many types of cancers can be detected early and may often be prevented. Depending on your health history and family history, you may need to have cancer screening at various ages. This may include screening for: Colorectal cancer. Prostate cancer. Skin cancer. Lung  cancer. What should I know about heart disease, diabetes, and high blood pressure? Blood pressure and heart disease High blood pressure causes heart disease and increases the risk of stroke. This is more likely to develop in people who have high blood pressure readings or are overweight. Talk with your health care provider about your target blood pressure readings. Have your blood pressure checked: Every 3-5 years if you are 18-39 years of age. Every year if you are 40 years old or older. If you are between the ages of 65 and 75 and are a current or former smoker, ask your health care provider if you should have a one-time screening for abdominal aortic aneurysm (AAA). Diabetes Have regular diabetes screenings. This checks your fasting blood sugar level. Have the screening done: Once every three years after age 45 if you are at a normal weight and have a low risk for diabetes. More often and at a younger age if you are overweight or have a high risk for diabetes. What should I know about preventing infection? Hepatitis B If you have a higher risk for hepatitis B, you should be screened for this virus. Talk with your health care provider to find out if you are at risk for hepatitis B infection. Hepatitis C Blood testing is recommended for: Everyone born from 1945 through 1965. Anyone with known risk factors for hepatitis C. Sexually transmitted infections (STIs) You should be screened each year for STIs, including gonorrhea and chlamydia, if: You are sexually active and are younger than 72 years of age. You are older than 72 years of age and your   health care provider tells you that you are at risk for this type of infection. Your sexual activity has changed since you were last screened, and you are at increased risk for chlamydia or gonorrhea. Ask your health care provider if you are at risk. Ask your health care provider about whether you are at high risk for HIV. Your health care provider  may recommend a prescription medicine to help prevent HIV infection. If you choose to take medicine to prevent HIV, you should first get tested for HIV. You should then be tested every 3 months for as long as you are taking the medicine. Follow these instructions at home: Alcohol use Do not drink alcohol if your health care provider tells you not to drink. If you drink alcohol: Limit how much you have to 0-2 drinks a day. Know how much alcohol is in your drink. In the [place] one drink equals one 12 oz bottle of beer (355 mL), one 5 oz glass of wine (148 mL), or one 1 oz glass of hard liquor (44 mL). Lifestyle Do not use any products that contain nicotine or tobacco. These products include cigarettes, chewing tobacco, and vaping devices, such as e-cigarettes. If you need help quitting, ask your health care provider. Do not use street drugs. Do not share needles. Ask your health care provider for help if you need support or information about quitting drugs. General instructions Schedule regular health, dental, and eye exams. Stay current with your vaccines. Tell your health care provider if: You often feel depressed. You have ever been abused or do not feel safe at home. Summary Adopting a healthy lifestyle and getting preventive care are important in promoting health and wellness. Follow your health care provider's instructions about healthy diet, exercising, and getting tested or screened for diseases. Follow your health care provider's instructions on monitoring your cholesterol and blood pressure. This information is not intended to replace advice given to you by your health care provider. Make sure you discuss any questions you have with your health care provider. Document Revised: 01/27/2021 Document Reviewed: 01/27/2021 Elsevier Patient Education  2023 [employer]  

## 2022-11-02 ENCOUNTER — Ambulatory Visit (INDEPENDENT_AMBULATORY_CARE_PROVIDER_SITE_OTHER): Payer: 59

## 2022-11-02 DIAGNOSIS — Z0000 Encounter for general adult medical examination without abnormal findings: Secondary | ICD-10-CM

## 2022-12-08 ENCOUNTER — Telehealth: Payer: Self-pay | Admitting: Family Medicine

## 2022-12-08 NOTE — Telephone Encounter (Signed)
Called patient to schedule Medicare Annual Wellness Visit (AWV). Left message for patient to call back and schedule Medicare Annual Wellness Visit (AWV).  Last date of AWV: AWVI eligible as of 04/21/2017  Please schedule an AWVI appointment at any time with [place].  If any questions, please contact me at [phone].    Thank you,  [place] Direct dial  [phone]

## 2022-12-15 ENCOUNTER — Other Ambulatory Visit: Payer: Self-pay | Admitting: Family Medicine

## 2022-12-15 DIAGNOSIS — G8929 Other chronic pain: Secondary | ICD-10-CM

## 2022-12-17 ENCOUNTER — Other Ambulatory Visit: Payer: Self-pay | Admitting: Family Medicine

## 2022-12-17 DIAGNOSIS — M545 Low back pain, unspecified: Secondary | ICD-10-CM

## 2023-02-16 ENCOUNTER — Other Ambulatory Visit: Payer: Self-pay

## 2023-02-16 MED ORDER — DICLOFENAC SODIUM 1 % EX GEL: 4.0000 g | Freq: Four times a day (QID) | CUTANEOUS | 11 refills | Status: DC

## 2023-04-02 ENCOUNTER — Ambulatory Visit (INDEPENDENT_AMBULATORY_CARE_PROVIDER_SITE_OTHER): Payer: 59

## 2023-04-02 VITALS — Ht 72.0 in | Wt 307.0 lb

## 2023-04-02 DIAGNOSIS — Z0000 Encounter for general adult medical examination without abnormal findings: Secondary | ICD-10-CM | POA: Diagnosis not present

## 2023-04-02 NOTE — Progress Notes (Signed)
Subjective:   Seth Sanders is a 72 y.o. male who presents for an Initial Medicare Annual Wellness Visit.  Visit Complete: Virtual  I connected with  Seth Sanders on 04/02/23 by a audio enabled telemedicine application and verified that I am speaking with the correct person using two identifiers.  Patient Location: Home  Provider Location: Home Office  I discussed the limitations of evaluation and management by telemedicine. The patient expressed understanding and agreed to proceed.  Review of Systems     Cardiac Risk Factors include: advanced age (>5men, >44 women);diabetes mellitus;dyslipidemia;hypertension;male gender;smoking/ tobacco exposure  Per patient no change in vitals since last visit, unable to obtain new vitals due to telehealth visit     Objective:    Today's Vitals   04/02/23 1241  Weight: (!) 307 lb (139.3 kg)  Height: 6' (1.829 m)   Body mass index is 41.64 kg/m.     04/02/2023   12:46 PM 09/25/2022    1:31 PM 11/10/2021    8:32 AM 04/23/2021    2:20 PM 12/26/2020    9:19 AM 11/20/2020   10:14 AM 05/15/2019    2:15 PM  Advanced Directives  Does Patient Have a Medical Advance Directive? No No No No No No No  Would patient like information on creating a medical advance directive? Yes (MAU/Ambulatory/Procedural Areas - Information given) No - Patient declined No - Patient declined No - Patient declined No - Patient declined No - Patient declined No - Patient declined    Current Medications (verified) Outpatient Encounter Medications as of 04/02/2023  Medication Sig   Accu-Chek Softclix Lancets lancets Use to check blood sugar 2x per day. E11.9   acetaminophen (TYLENOL 8 HOUR) 650 MG CR tablet Take 1 tablet (650 mg total) by mouth every 8 (eight) hours as needed for pain.   allopurinol (ZYLOPRIM) 300 MG tablet Take 0.5 tablets (150 mg total) by mouth daily.   amLODipine (NORVASC) 10 MG tablet Take 1 tablet (10 mg total) by mouth daily.   atorvastatin (LIPITOR)  40 MG tablet Take 1 tablet (40 mg total) by mouth daily.   baclofen (LIORESAL) 10 MG tablet Take 1 tablet (10 mg total) by mouth 3 (three) times daily as needed for muscle spasms.   Blood Glucose Monitoring Suppl (ACCU-CHEK AVIVA PLUS) w/Device KIT To check blood sugars twice daily.   diclofenac Sodium (VOLTAREN) 1 % GEL Apply 4 g topically 4 (four) times daily.   furosemide (LASIX) 20 MG tablet Take one tablet daily as needed for leg swelling.   gabapentin (NEURONTIN) 300 MG capsule TAKE 1 CAPSULE(300 MG) BY MOUTH THREE TIMES DAILY AS NEEDED   glucose blood (ACCU-CHEK AVIVA PLUS) test strip CHECK BLOOD SUGAR TWICE DAILY   Lido-Capsaicin-Men-Methyl Sal (1ST MEDX-PATCH/ LIDOCAINE) 4-0.025-5-20 % PTCH Apply to lower back daily as needed   lisinopril (ZESTRIL) 40 MG tablet Take 1 tablet (40 mg total) by mouth daily.   metFORMIN (GLUCOPHAGE) 1000 MG tablet Take 1 tablet (1,000 mg total) by mouth 2 (two) times daily with a meal.   spironolactone (ALDACTONE) 25 MG tablet Take 1 tablet (25 mg total) by mouth daily.   traMADol (ULTRAM) 50 MG tablet TAKE 1 TABLET(50 MG) BY MOUTH EVERY 12 HOURS AS NEEDED   No facility-administered encounter medications on file as of 04/02/2023.    Allergies (verified) Carvedilol   History: Past Medical History:  Diagnosis Date   CHF (congestive heart failure) (HCC)    Gout    Heart murmur  Hyperlipidemia    Hypertension    Type 2 diabetes mellitus with hyperglycemia (HCC) 11/21/2020   Past Surgical History:  Procedure Laterality Date   NO PAST SURGERIES     Family History  Problem Relation Age of Onset   Heart attack Mother    Hypertension Mother    Social History   Socioeconomic History   Marital status: Widowed    Spouse name: Not on file   Number of children: Not on file   Years of education: Not on file   Highest education level: Not on file  Occupational History    Employer: [employer]  Tobacco Use   Smoking status: Every Day     Current packs/day: 0.50    Average packs/day: 0.5 packs/day for 35.0 years (17.5 ttl pk-yrs)    Types: Cigarettes   Smokeless tobacco: Never   Tobacco comments:     Not interested now in quitting/ls  Vaping Use   Vaping status: Never Used  Substance and Sexual Activity   Alcohol use: No   Drug use: No   Sexual activity: Not on file  Other Topics Concern   Not on file  Social History Narrative   Works in a nursing home.   Social Determinants of Health   Financial Resource Strain: Low Risk  (04/02/2023)   Overall Financial Resource Strain (CARDIA)    Difficulty of Paying Living Expenses: Not hard at all  Food Insecurity: No Food Insecurity (04/02/2023)   Hunger Vital Sign    Worried About Running Out of Food in the Last Year: Never true    Ran Out of Food in the Last Year: Never true  Transportation Needs: No Transportation Needs (04/02/2023)   PRAPARE - Administrator, Civil Service (Medical): No    Lack of Transportation (Non-Medical): No  Physical Activity: Sufficiently Active (04/02/2023)   Exercise Vital Sign    Days of Exercise per Week: 5 days    Minutes of Exercise per Session: 30 min  Stress: No Stress Concern Present (04/02/2023)   [employer] of Occupational Health - Occupational Stress Questionnaire    Feeling of Stress : Not at all  Social Connections: Moderately Isolated (04/02/2023)   Social Connection and Isolation Panel [NHANES]    Frequency of Communication with Friends and Family: More than three times a week    Frequency of Social Gatherings with Friends and Family: Three times a week    Attends Religious Services: 1 to 4 times per year    Active Member of Clubs or Organizations: No    Attends Banker Meetings: Never    Marital Status: Widowed    Tobacco Counseling Ready to quit: Not Answered Counseling given: Not Answered Tobacco comments:  Not interested now in quitting/ls   Clinical Intake:  Pre-visit preparation  completed: Yes  Pain : No/denies pain     Diabetes: Yes CBG done?: No Did pt. bring in CBG monitor from home?: No  How often do you need to have someone help you when you read instructions, pamphlets, or other written materials from your doctor or pharmacy?: 1 - Never  Interpreter Needed?: No  Information entered by :: Kandis Fantasia LPN   Activities of Daily Living    04/02/2023   12:43 PM  In your present state of health, do you have any difficulty performing the following activities:  Hearing? 0  Vision? 0  Difficulty concentrating or making decisions? 0  Walking or climbing stairs? 0  Dressing  or bathing? 0  Doing errands, shopping? 0  Preparing Food and eating ? N  Using the Toilet? N  In the past six months, have you accidently leaked urine? N  Do you have problems with loss of bowel control? N  Managing your Medications? N  Managing your Finances? N  Housekeeping or managing your Housekeeping? N    Patient Care Team: Cyndia Skeeters, DO as PCP - General (Family Medicine) Lyn Records, MD (Inactive) as PCP - Cardiology (Cardiology)  Indicate any recent Medical Services you may have received from other than [facility] providers in the past year (date may be approximate).     Assessment:   This is a routine wellness examination for Seven.  Hearing/Vision screen Hearing Screening - Comments:: Denies hearing difficulties   Vision Screening - Comments:: No vision problems; will schedule routine eye exam soon    Dietary issues and exercise activities discussed:     Goals Addressed             This Visit's Progress    Remain active and independent        Depression Screen    04/02/2023   12:45 PM 09/25/2022    1:31 PM 11/10/2021    8:33 AM 04/23/2021    2:20 PM 12/26/2020    9:31 AM 11/25/2020   11:00 AM 11/20/2020   10:13 AM  PHQ 2/9 Scores  PHQ - 2 Score 4 6 6 6 6 6  0  PHQ- 9 Score 6 9 15 16 24 21 12     Fall Risk    04/02/2023   12:46 PM 09/25/2022     1:31 PM 11/10/2021    8:33 AM 04/23/2021    2:20 PM 11/25/2020   11:01 AM  Fall Risk   Falls in the past year? 0 0 0 0 0  Number falls in past yr: 0 0 0 0 0  Injury with Fall? 0  0 0 0  Risk for fall due to : No Fall Risks      Follow up Falls prevention discussed;Education provided;Falls evaluation completed        MEDICARE RISK AT HOME:  Medicare Risk at Home - 04/02/23 1247     Any stairs in or around the home? No    If so, are there any without handrails? No    Home free of loose throw rugs in walkways, pet beds, electrical cords, etc? Yes    Adequate lighting in your home to reduce risk of falls? Yes    Life alert? No    Use of a cane, walker or w/c? No    Grab bars in the bathroom? Yes    Shower chair or bench in shower? No    Elevated toilet seat or a handicapped toilet? No             TIMED UP AND GO:  Was the test performed? No    Cognitive Function:        04/02/2023   12:47 PM  6CIT Screen  What Year? 0 points  What month? 0 points  What time? 0 points  Count back from 20 0 points  Months in reverse 0 points  Repeat phrase 0 points  Total Score 0 points    Immunizations Immunization History  Administered Date(s) Administered   Moderna Sars-Covid-2 Vaccination 05/25/2020, 06/22/2020   Pneumococcal Conjugate-13 11/08/2017    TDAP status: Due, Education has been provided regarding the importance of this vaccine. Advised may receive this  vaccine at local pharmacy or Health Dept. Aware to provide a copy of the vaccination record if obtained from local pharmacy or Health Dept. Verbalized acceptance and understanding.  Pneumococcal vaccine status: Declined,  Education has been provided regarding the importance of this vaccine but patient still declined. Advised may receive this vaccine at local pharmacy or Health Dept. Aware to provide a copy of the vaccination record if obtained from local pharmacy or Health Dept. Verbalized acceptance and understanding.    Covid-19 vaccine status: Declined, Education has been provided regarding the importance of this vaccine but patient still declined. Advised may receive this vaccine at local pharmacy or Health Dept.or vaccine clinic. Aware to provide a copy of the vaccination record if obtained from local pharmacy or Health Dept. Verbalized acceptance and understanding.  Qualifies for Shingles Vaccine? Yes   Zostavax completed No   Shingrix Completed?: No.    Education has been provided regarding the importance of this vaccine. Patient has been advised to call insurance company to determine out of pocket expense if they have not yet received this vaccine. Advised may also receive vaccine at local pharmacy or Health Dept. Verbalized acceptance and understanding.  Screening Tests Health Maintenance  Topic Date Due   Colonoscopy  Never done   OPHTHALMOLOGY EXAM  01/19/2014   FOOT EXAM  10/22/2022   HEMOGLOBIN A1C  03/26/2023   COVID-19 Vaccine (3 - Moderna risk series) 04/18/2023 (Originally 07/20/2020)   Zoster Vaccines- Shingrix (1 of 2) 07/03/2023 (Originally 05/11/1970)   Pneumonia Vaccine 70+ Years old (2 of 2 - PPSV23 or PCV20) 04/01/2024 (Originally 01/03/2018)   INFLUENZA VACCINE  04/22/2023   Diabetic kidney evaluation - eGFR measurement  09/26/2023   Diabetic kidney evaluation - Urine ACR  09/26/2023   Medicare Annual Wellness (AWV)  04/01/2024   Hepatitis C Screening  Completed   HPV VACCINES  Aged Out   DTaP/Tdap/Td  Discontinued    Health Maintenance  Health Maintenance Due  Topic Date Due   Colonoscopy  Never done   OPHTHALMOLOGY EXAM  01/19/2014   FOOT EXAM  10/22/2022   HEMOGLOBIN A1C  03/26/2023    Colorectal cancer screening:  Cologuard ordered and kit received; patient yet to complete  Lung Cancer Screening: (Low Dose CT Chest recommended if Age 58-80 years, 20 pack-year currently smoking OR have quit w/in 15years.) does not qualify.   Lung Cancer Screening Referral:  n/a  Additional Screening:  Hepatitis C Screening: does qualify; Completed 11/08/17  Vision Screening: Recommended annual ophthalmology exams for early detection of glaucoma and other disorders of the eye. Is the patient up to date with their annual eye exam?  No  Who is the provider or what is the name of the office in which the patient attends annual eye exams? none If pt is not established with a provider, would they like to be referred to a provider to establish care?  Referral placed 09/25/22 .   Dental Screening: Recommended annual dental exams for proper oral hygiene  Diabetic Foot Exam: Diabetic Foot Exam: Overdue, Pt has been advised about the importance in completing this exam. Pt is scheduled for diabetic foot exam on at next office visit.  Community Resource Referral / Chronic Care Management: CRR required this visit?  No   CCM required this visit?  No    Plan:     I have personally reviewed and noted the following in the patient's chart:   Medical and social history Use of alcohol, tobacco or illicit drugs  Current medications and supplements including opioid prescriptions. Patient is not currently taking opioid prescriptions. Functional ability and status Nutritional status Physical activity Advanced directives List of other physicians Hospitalizations, surgeries, and ER visits in previous 12 months Vitals Screenings to include cognitive, depression, and falls Referrals and appointments  In addition, I have reviewed and discussed with patient certain preventive protocols, quality metrics, and best practice recommendations. A written personalized care plan for preventive services as well as general preventive health recommendations were provided to patient.     Kandis Fantasia [place], [place]   1/61/0960   After Visit Summary: (Mail) Due to this being a telephonic visit, the after visit summary with patients personalized plan was offered to patient via mail    Nurse Notes: No concerns

## 2023-04-02 NOTE — Patient Instructions (Signed)
[name] , Thank you for taking time to come for your Medicare Wellness Visit. I appreciate your ongoing commitment to your health goals. Please review the following plan we discussed and let me know if I can assist you in the future.   These are the goals we discussed:  Goals      Remain active and independent        This is a list of the screening recommended for you and due dates:  Health Maintenance  Topic Date Due   Colon Cancer Screening  Never done   Eye exam for diabetics  01/19/2014   Complete foot exam   10/22/2022   Hemoglobin A1C  03/26/2023   COVID-19 Vaccine (3 - Moderna risk series) 04/18/2023*   Zoster (Shingles) Vaccine (1 of 2) 07/03/2023*   Pneumonia Vaccine (2 of 2 - PPSV23 or PCV20) 04/01/2024*   Flu Shot  04/22/2023   Yearly kidney function blood test for diabetes  09/26/2023   Yearly kidney health urinalysis for diabetes  09/26/2023   Medicare Annual Wellness Visit  04/01/2024   Hepatitis C Screening  Completed   HPV Vaccine  Aged Out   DTaP/Tdap/Td vaccine  Discontinued  *Topic was postponed. The date shown is not the original due date.    Advanced directives: Information on Advanced Care Planning can be found at [facility] Advance Health Care Directives Advance Health Care Directives ([email or web address] Please bring a copy of your health care power of attorney and living will to the office to be added to your chart at your convenience.  Conditions/risks identified: Aim for 30 minutes of exercise or brisk walking, 6-8 glasses of water, and 5 servings of fruits and vegetables each day.  Next appointment: Follow up in one year for your annual wellness visit.   Preventive Care 55 Years and Older, Male  Preventive care refers to lifestyle choices and visits with your health care provider that can promote health and wellness. What does preventive care include? A yearly physical exam. This is also called an annual well check. Dental exams  once or twice a year. Routine eye exams. Ask your health care provider how often you should have your eyes checked. Personal lifestyle choices, including: Daily care of your teeth and gums. Regular physical activity. Eating a healthy diet. Avoiding tobacco and drug use. Limiting alcohol use. Practicing safe sex. Taking low doses of aspirin every day. Taking vitamin and mineral supplements as recommended by your health care provider. What happens during an annual well check? The services and screenings done by your health care provider during your annual well check will depend on your age, overall health, lifestyle risk factors, and family history of disease. Counseling  Your health care provider may ask you questions about your: Alcohol use. Tobacco use. Drug use. Emotional well-being. Home and relationship well-being. Sexual activity. Eating habits. History of falls. Memory and ability to understand (cognition). Work and work Astronomer. Screening  You may have the following tests or measurements: Height, weight, and BMI. Blood pressure. Lipid and cholesterol levels. These may be checked every 5 years, or more frequently if you are over 110 years old. Skin check. Lung cancer screening. You may have this screening every year starting at age 74 if you have a 30-pack-year history of smoking and currently smoke or have quit within the past 15 years. Fecal occult blood test (FOBT) of the stool. You may have this test every year starting at age 57. Flexible sigmoidoscopy or  colonoscopy. You may have a sigmoidoscopy every 5 years or a colonoscopy every 10 years starting at age 16. Prostate cancer screening. Recommendations will vary depending on your family history and other risks. Hepatitis C blood test. Hepatitis B blood test. Sexually transmitted disease (STD) testing. Diabetes screening. This is done by checking your blood sugar (glucose) after you have not eaten for a while  (fasting). You may have this done every 1-3 years. Abdominal aortic aneurysm (AAA) screening. You may need this if you are a current or former smoker. Osteoporosis. You may be screened starting at age 53 if you are at high risk. Talk with your health care provider about your test results, treatment options, and if necessary, the need for more tests. Vaccines  Your health care provider may recommend certain vaccines, such as: Influenza vaccine. This is recommended every year. Tetanus, diphtheria, and acellular pertussis (Tdap, Td) vaccine. You may need a Td booster every 10 years. Zoster vaccine. You may need this after age 8. Pneumococcal 13-valent conjugate (PCV13) vaccine. One dose is recommended after age 55. Pneumococcal polysaccharide (PPSV23) vaccine. One dose is recommended after age 61. Talk to your health care provider about which screenings and vaccines you need and how often you need them. This information is not intended to replace advice given to you by your health care provider. Make sure you discuss any questions you have with your health care provider. Document Released: 10/04/2015 Document Revised: 05/27/2016 Document Reviewed: 07/09/2015 Elsevier Interactive Patient Education  2017 [employer].  Fall Prevention in the Home Falls can cause injuries. They can happen to people of all ages. There are many things you can do to make your home safe and to help prevent falls. What can I do on the outside of my home? Regularly fix the edges of walkways and driveways and fix any cracks. Remove anything that might make you trip as you walk through a door, such as a raised step or threshold. Trim any bushes or trees on the path to your home. Use bright outdoor lighting. Clear any walking paths of anything that might make someone trip, such as rocks or tools. Regularly check to see if handrails are loose or broken. Make sure that both sides of any steps have handrails. Any raised  decks and porches should have guardrails on the edges. Have any leaves, snow, or ice cleared regularly. Use sand or salt on walking paths during winter. Clean up any spills in your garage right away. This includes oil or grease spills. What can I do in the bathroom? Use night lights. Install grab bars by the toilet and in the tub and shower. Do not use towel bars as grab bars. Use non-skid mats or decals in the tub or shower. If you need to sit down in the shower, use a plastic, non-slip stool. Keep the floor dry. Clean up any water that spills on the floor as soon as it happens. Remove soap buildup in the tub or shower regularly. Attach bath mats securely with double-sided non-slip rug tape. Do not have throw rugs and other things on the floor that can make you trip. What can I do in the bedroom? Use night lights. Make sure that you have a light by your bed that is easy to reach. Do not use any sheets or blankets that are too big for your bed. They should not hang down onto the floor. Have a firm chair that has side arms. You can use this for support while  you get dressed. Do not have throw rugs and other things on the floor that can make you trip. What can I do in the kitchen? Clean up any spills right away. Avoid walking on wet floors. Keep items that you use a lot in easy-to-reach places. If you need to reach something above you, use a strong step stool that has a grab bar. Keep electrical cords out of the way. Do not use floor polish or wax that makes floors slippery. If you must use wax, use non-skid floor wax. Do not have throw rugs and other things on the floor that can make you trip. What can I do with my stairs? Do not leave any items on the stairs. Make sure that there are handrails on both sides of the stairs and use them. Fix handrails that are broken or loose. Make sure that handrails are as long as the stairways. Check any carpeting to make sure that it is firmly attached  to the stairs. Fix any carpet that is loose or worn. Avoid having throw rugs at the top or bottom of the stairs. If you do have throw rugs, attach them to the floor with carpet tape. Make sure that you have a light switch at the top of the stairs and the bottom of the stairs. If you do not have them, ask someone to add them for you. What else can I do to help prevent falls? Wear shoes that: Do not have high heels. Have rubber bottoms. Are comfortable and fit you well. Are closed at the toe. Do not wear sandals. If you use a stepladder: Make sure that it is fully opened. Do not climb a closed stepladder. Make sure that both sides of the stepladder are locked into place. Ask someone to hold it for you, if possible. Clearly mark and make sure that you can see: Any grab bars or handrails. First and last steps. Where the edge of each step is. Use tools that help you move around (mobility aids) if they are needed. These include: Canes. Walkers. Scooters. Crutches. Turn on the lights when you go into a dark area. Replace any light bulbs as soon as they burn out. Set up your furniture so you have a clear path. Avoid moving your furniture around. If any of your floors are uneven, fix them. If there are any pets around you, be aware of where they are. Review your medicines with your doctor. Some medicines can make you feel dizzy. This can increase your chance of falling. Ask your doctor what other things that you can do to help prevent falls. This information is not intended to replace advice given to you by your health care provider. Make sure you discuss any questions you have with your health care provider. Document Released: 07/04/2009 Document Revised: 02/13/2016 Document Reviewed: 10/12/2014 Elsevier Interactive Patient Education  2017 [employer].

## 2023-04-12 ENCOUNTER — Other Ambulatory Visit: Payer: Self-pay

## 2023-04-12 DIAGNOSIS — G8929 Other chronic pain: Secondary | ICD-10-CM

## 2023-04-12 MED ORDER — BACLOFEN 10 MG PO TABS: 10.0000 mg | ORAL_TABLET | Freq: Three times a day (TID) | ORAL | 0 refills | Status: DC | PRN

## 2023-04-15 NOTE — Telephone Encounter (Signed)
Patient LVM on nurse line regarding prescription refill.   Attempted to return call to patient. He did not answer, LVM asking that he return call to office.   When patient calls back, please assist him in scheduling follow up appointment with PCP.   Veronda Prude, RN

## 2023-04-23 ENCOUNTER — Other Ambulatory Visit: Payer: Self-pay

## 2023-04-23 DIAGNOSIS — I10 Essential (primary) hypertension: Secondary | ICD-10-CM

## 2023-04-25 MED ORDER — AMLODIPINE BESYLATE 10 MG PO TABS: 10.0000 mg | ORAL_TABLET | Freq: Every day | ORAL | 1 refills | Status: DC

## 2023-04-26 ENCOUNTER — Other Ambulatory Visit: Payer: Self-pay

## 2023-04-26 DIAGNOSIS — I10 Essential (primary) hypertension: Secondary | ICD-10-CM

## 2023-04-26 DIAGNOSIS — I5032 Chronic diastolic (congestive) heart failure: Secondary | ICD-10-CM

## 2023-04-26 MED ORDER — FUROSEMIDE 20 MG PO TABS
ORAL_TABLET | ORAL | 0 refills | Status: DC
Start: 2023-04-26 — End: 2023-05-05

## 2023-04-26 MED ORDER — SPIRONOLACTONE 25 MG PO TABS
25.0000 mg | ORAL_TABLET | Freq: Every day | ORAL | 0 refills | Status: DC
Start: 2023-04-26 — End: 2023-05-05

## 2023-04-26 MED ORDER — LISINOPRIL 40 MG PO TABS
40.0000 mg | ORAL_TABLET | Freq: Every day | ORAL | 0 refills | Status: DC
Start: 2023-04-26 — End: 2023-05-05

## 2023-04-26 NOTE — Telephone Encounter (Signed)
Refilled BP meds until patient sees PCP on 8/14.

## 2023-04-26 NOTE — Telephone Encounter (Signed)
Patient calls nurse line requesting refills on BP medications.   He has scheduled follow up appointment with PCP on 05/05/23.  Please advise if refills can be sent in for patient until this appointment.   Veronda Prude, RN

## 2023-05-05 ENCOUNTER — Other Ambulatory Visit: Payer: Self-pay

## 2023-05-05 ENCOUNTER — Ambulatory Visit (INDEPENDENT_AMBULATORY_CARE_PROVIDER_SITE_OTHER): Payer: 59 | Admitting: Family Medicine

## 2023-05-05 ENCOUNTER — Encounter: Payer: Self-pay | Admitting: Family Medicine

## 2023-05-05 VITALS — BP 156/84 | HR 106 | Ht 72.0 in | Wt 293.4 lb

## 2023-05-05 DIAGNOSIS — M545 Low back pain, unspecified: Secondary | ICD-10-CM

## 2023-05-05 DIAGNOSIS — I10 Essential (primary) hypertension: Secondary | ICD-10-CM | POA: Diagnosis not present

## 2023-05-05 DIAGNOSIS — G8929 Other chronic pain: Secondary | ICD-10-CM | POA: Diagnosis not present

## 2023-05-05 DIAGNOSIS — R634 Abnormal weight loss: Secondary | ICD-10-CM | POA: Insufficient documentation

## 2023-05-05 DIAGNOSIS — M25511 Pain in right shoulder: Secondary | ICD-10-CM

## 2023-05-05 DIAGNOSIS — I5032 Chronic diastolic (congestive) heart failure: Secondary | ICD-10-CM | POA: Diagnosis not present

## 2023-05-05 DIAGNOSIS — Z720 Tobacco use: Secondary | ICD-10-CM

## 2023-05-05 DIAGNOSIS — E118 Type 2 diabetes mellitus with unspecified complications: Secondary | ICD-10-CM

## 2023-05-05 DIAGNOSIS — M79601 Pain in right arm: Secondary | ICD-10-CM | POA: Insufficient documentation

## 2023-05-05 DIAGNOSIS — F17210 Nicotine dependence, cigarettes, uncomplicated: Secondary | ICD-10-CM | POA: Diagnosis not present

## 2023-05-05 LAB — POCT GLYCOSYLATED HEMOGLOBIN (HGB A1C): HbA1c, POC (controlled diabetic range): 6.9 % (ref 0.0–7.0)

## 2023-05-05 MED ORDER — LISINOPRIL 40 MG PO TABS
40.0000 mg | ORAL_TABLET | Freq: Every day | ORAL | 0 refills | Status: DC
Start: 2023-05-05 — End: 2023-06-25

## 2023-05-05 MED ORDER — SPIRONOLACTONE 25 MG PO TABS
25.0000 mg | ORAL_TABLET | Freq: Every day | ORAL | 0 refills | Status: DC
Start: 2023-05-05 — End: 2023-06-25

## 2023-05-05 MED ORDER — FUROSEMIDE 20 MG PO TABS
ORAL_TABLET | ORAL | 0 refills | Status: DC
Start: 2023-05-05 — End: 2023-12-28

## 2023-05-05 NOTE — Assessment & Plan Note (Addendum)
Patient with recent right arm pain and decreased range of motion.No apparent inciting injury. - ordered R shoulder xray - handout given on ROM exercises, patient educated on how to do these daily. He states agreement. - Long discussion with patient about maintaining activity with R shoulder and making sure his ROM does not decrease. - consider PT in the future - patient already has tramadol, gabapentin, baclofen, voltaren gel, lidocaine patches for chronic low back pain; educated patient that these may also help his shoulder and he may use voltaren gel and lidocaine patches to shoulder. May also use heat/cold.

## 2023-05-05 NOTE — Assessment & Plan Note (Signed)
Initial blood pressure on presentation 163/85.  156/84 on recheck.  Patient has not been taking blood pressure medications regularly lately. -Have provided refills of blood pressure medications and instructions on how to take them correctly.  Patient states understanding.

## 2023-05-05 NOTE — Assessment & Plan Note (Signed)
Patient has a long history of 1 pack every 3 days since he was 72 years old. - low dose CT to evaluate for malignancy - patient not interested in cessation at this time.

## 2023-05-05 NOTE — Assessment & Plan Note (Addendum)
Patient 293 pounds today.  This is 14 pound weight loss in the last month.  Patient does report he is cut out all fried foods from his diet since last visit and attributes his weight loss to this. -Recheck weight at follow-up in 1 month -Discussed with patient concern for weight loss that is too rapid as this could be a sign of malignancy. Patient agreeable to low-dose CT lung cancer screening.  Will consider Cologuard vs and revisit discussion at next visit.   -CBC, hepatitis panel, BMP, PSA today

## 2023-05-05 NOTE — Assessment & Plan Note (Addendum)
This is a chronic problem for patient. Has tramadol, gabapentin, baclofen, Voltaren gel, lidocaine patch, and uses Tylenol twice daily as needed for this issue. -Ordered lumbar spine film evaluation -Some concern for potential malignancy given patient recent weight loss. Plans as above.

## 2023-05-05 NOTE — Progress Notes (Signed)
SUBJECTIVE:   CHIEF COMPLAINT / HPI:   HTN Patient reports not taking amlodipine, lisinopril, spironolactone regularly.  He got confused about what to take and is not sure that he had refills.  Requesting refills today.  Denies headaches, vision changes, chest pain, shortness of breath.  Blood pressure on arrival 163/85.  T2DM Takes metformin daily.  States he has tried to eat a better diet recently, cut out all fried foods.  Feels he has lost some weight.  Does not check his blood sugars very often, and cannot remember what they normally range.  New onset right arm pain and decreased range of motion This has been going on several months.  Patient denies inciting injury.  He reports tingling down his arm and into his hand.  Reports slight tremor to right hand.  States decreased range of motion.  Reports this has only mildly affected his ADLs.  Chronic low back pain Ongoing problem.  Has not worsened.  Patient has been taking tramadol, gabapentin, baclofen, Tylenol, Voltaren gel, lidocaine patch as needed with mild relief.  He reports back pain is worse when he is sitting and lying down.  Sciatic pattern.  No numbness of legs/saddle anesthesia.  No loss of bowel or bladder control.  PERTINENT  PMH / PSH:  Past Medical History:  Diagnosis Date   CHF (congestive heart failure) (HCC)    Gout    Heart murmur    Hyperlipidemia    Hypertension    Type 2 diabetes mellitus with hyperglycemia (HCC) 11/21/2020     OBJECTIVE:   BP (!) 156/84   Pulse (!) 106   Ht 6' (1.829 m)   Wt 293 lb 6.4 oz (133.1 kg)   SpO2 98%   BMI 39.79 kg/m   General: Very pleasant, chronically ill-appearing, no acute distress Cardio: RRR Pulm: Clear, no wheezing, no crackles. No increased work of breathing. Abdominal: Normoactive bowel sounds present, soft, non-tender, non-distended. MSK: Reduced range of motion in right shoulder as compared to left.  Intact UE strength 5/5 bilaterally.  Significant muscle  tension over right trapezius, no tenderness to palpation.  Some muscle tension bilaterally in the low back, difficult to ascertain if this is tender to palpation vs patient is ticklish.   ASSESSMENT/PLAN:   Essential hypertension Initial blood pressure on presentation 163/85.  156/84 on recheck.  Patient has not been taking blood pressure medications regularly lately. -Have provided refills of blood pressure medications and instructions on how to take them correctly.  Patient states understanding.  Type 2 diabetes mellitus with complication, without long-term current use of insulin (HCC) Patient takes metformin daily.  A1c today at goal 6.9.  Patient notes he has recently cut out all fried foods from his diet in order to be healthier. -Continue metformin -Continue healthy diet  Weight loss Patient 293 pounds today.  This is 14 pound weight loss in the last month.  Patient does report he is cut out all fried foods from his diet since last visit and attributes his weight loss to this. -Recheck weight at follow-up in 1 month -Discussed with patient concern for weight loss that is too rapid as this could be a sign of malignancy. Patient agreeable to low-dose CT lung cancer screening.  Will consider Cologuard vs and revisit discussion at next visit.   -CBC, hepatitis panel, BMP, PSA today  Right arm pain Patient with recent right arm pain and decreased range of motion.No apparent inciting injury. - ordered R shoulder xray -  handout given on ROM exercises, patient educated on how to do these daily. He states agreement. - Long discussion with patient about maintaining activity with R shoulder and making sure his ROM does not decrease. - consider PT in the future - patient already has tramadol, gabapentin, baclofen, voltaren gel, lidocaine patches for chronic low back pain; educated patient that these may also help his shoulder and he may use voltaren gel and lidocaine patches to shoulder. May also  use heat/cold.  Low back pain This is a chronic problem for patient. Has tramadol, gabapentin, baclofen, Voltaren gel, lidocaine patch, and uses Tylenol twice daily as needed for this issue. -Ordered lumbar spine film evaluation -Some concern for potential malignancy given patient recent weight loss. Plans as above.  Tobacco abuse Patient has a long history of 1 pack every 3 days since he was 72 years old. - low dose CT to evaluate for malignancy - patient not interested in cessation at this time.   Again, patient to return in 1 month for follow up of labs, imaging, multiple issues as above.  Cyndia Skeeters, DO [facility]

## 2023-05-05 NOTE — Assessment & Plan Note (Signed)
Patient takes metformin daily.  A1c today at goal 6.9.  Patient notes he has recently cut out all fried foods from his diet in order to be healthier. -Continue metformin -Continue healthy diet

## 2023-05-05 NOTE — Patient Instructions (Signed)
Dear Seth Sanders  Today we discussed the following concerns and plans:  Right arm pain and decreased range of motion: -Please go to [facility] imaging and get a shoulder x-ray -Please do the shoulder exercises provided in the handout a few times a day -Continue to take Tylenol as needed for discomfort  Low back pain: -Go to [facility] imaging and get an x-ray of your low back -Continue to take your medicines as prescribed for your back pain.  Use your Voltaren gel and lidocaine patches as needed. -If your back pain becomes very severe, or you have numbness in your legs, or you lose control of your bladder or bowel please go to the emergency department to be evaluated.  High blood pressure: -I have sent your medication refills to your pharmacy.  Please start taking these.  Weight loss: -Please continue to weigh yourself at home and monitor your weight.  Continue with healthy diet.  Smoking: -I have ordered the low-dose CT scan of your lungs that we talked about.  Please make this appointment when they call you.  Make an appointment to follow-up with me in 1 month so we can check your weight and your blood pressure.  We can talk about Cologuard screening for colon cancer at that time.  I have also refilled your Lasix today.  If you have any concerns, please call the clinic or schedule an appointment.  It was a pleasure to take care of you today. Be well!  Cyndia Skeeters, DO [facility], PGY-1

## 2023-05-06 LAB — CBC
Hematocrit: 44.9 % (ref 37.5–51.0)
Hemoglobin: 14.8 g/dL (ref 13.0–17.7)
MCH: 29.4 pg (ref 26.6–33.0)
MCHC: 33 g/dL (ref 31.5–35.7)
MCV: 89 fL (ref 79–97)
Platelets: 182 10*3/uL (ref 150–450)
RBC: 5.04 x10E6/uL (ref 4.14–5.80)
RDW: 13.8 % (ref 11.6–15.4)
WBC: 8.8 10*3/uL (ref 3.4–10.8)

## 2023-05-06 LAB — BASIC METABOLIC PANEL
BUN/Creatinine Ratio: 13 (ref 10–24)
BUN: 11 mg/dL (ref 8–27)
CO2: 24 mmol/L (ref 20–29)
Calcium: 10.4 mg/dL — ABNORMAL HIGH (ref 8.6–10.2)
Chloride: 101 mmol/L (ref 96–106)
Creatinine, Ser: 0.86 mg/dL (ref 0.76–1.27)
Glucose: 132 mg/dL — ABNORMAL HIGH (ref 70–99)
Potassium: 4.1 mmol/L (ref 3.5–5.2)
Sodium: 144 mmol/L (ref 134–144)
eGFR: 93 mL/min/{1.73_m2} (ref >59)

## 2023-05-06 LAB — HEPATIC FUNCTION PANEL
ALT: 28 IU/L (ref 0–44)
AST: 22 IU/L (ref 0–40)
Albumin: 4.8 g/dL (ref 3.8–4.8)
Alkaline Phosphatase: 69 IU/L (ref 44–121)
Bilirubin Total: 0.5 mg/dL (ref 0.0–1.2)
Bilirubin, Direct: 0.18 mg/dL (ref 0.00–0.40)
Total Protein: 7.4 g/dL (ref 6.0–8.5)

## 2023-05-06 LAB — PSA: Prostate Specific Ag, Serum: 1.9 ng/mL (ref 0.0–4.0)

## 2023-05-19 ENCOUNTER — Other Ambulatory Visit: Payer: Self-pay

## 2023-05-19 DIAGNOSIS — M545 Other chronic pain: Secondary | ICD-10-CM

## 2023-05-20 NOTE — Telephone Encounter (Addendum)
Patient requested refill of Tramadol. Unfortunately pt and I did not discuss this in much detail at last visit, so cannot refill today. Originally prescribed by another resident physician.  Staff called patient and pt stated back pain is ongoing but no worse than before. Offered pt follow up visit sooner but pt declined. He is scheduled to follow up with Dr. Royal Piedra on 06/04/23. May consider tramadol refill at that time.

## 2023-06-04 ENCOUNTER — Ambulatory Visit: Payer: 59 | Admitting: Student

## 2023-06-24 ENCOUNTER — Telehealth: Payer: Self-pay

## 2023-06-24 NOTE — Telephone Encounter (Signed)
Patient calls nurse line regarding prescriptions that were sent in on 05/05/23.   He reports that he only received 12 tablets of each medication.   He is requesting refills for 90 days.   Please send new prescriptions to [facility] on [address].   Veronda Prude, RN

## 2023-06-25 ENCOUNTER — Other Ambulatory Visit: Payer: Self-pay | Admitting: Family Medicine

## 2023-06-25 DIAGNOSIS — I10 Essential (primary) hypertension: Secondary | ICD-10-CM

## 2023-06-25 MED ORDER — LISINOPRIL 40 MG PO TABS
40.0000 mg | ORAL_TABLET | Freq: Every day | ORAL | 3 refills | Status: DC
Start: 2023-06-25 — End: 2024-01-03

## 2023-06-25 MED ORDER — SPIRONOLACTONE 25 MG PO TABS
25.0000 mg | ORAL_TABLET | Freq: Every day | ORAL | 3 refills | Status: DC
Start: 2023-06-25 — End: 2024-01-03

## 2023-07-01 ENCOUNTER — Other Ambulatory Visit: Payer: Self-pay | Admitting: Family Medicine

## 2023-09-28 ENCOUNTER — Other Ambulatory Visit: Payer: Self-pay | Admitting: Family Medicine

## 2023-09-28 DIAGNOSIS — I10 Essential (primary) hypertension: Secondary | ICD-10-CM

## 2023-10-01 ENCOUNTER — Other Ambulatory Visit: Payer: Self-pay

## 2023-10-01 MED ORDER — ATORVASTATIN CALCIUM 40 MG PO TABS: 40.0000 mg | ORAL_TABLET | Freq: Every day | ORAL | 3 refills | Status: DC

## 2023-10-08 ENCOUNTER — Other Ambulatory Visit: Payer: Self-pay | Admitting: Family Medicine

## 2023-10-08 DIAGNOSIS — I5032 Chronic diastolic (congestive) heart failure: Secondary | ICD-10-CM

## 2023-10-29 ENCOUNTER — Ambulatory Visit: Payer: 59 | Admitting: Student

## 2023-12-28 ENCOUNTER — Other Ambulatory Visit: Payer: Self-pay

## 2023-12-28 DIAGNOSIS — I5032 Chronic diastolic (congestive) heart failure: Secondary | ICD-10-CM

## 2023-12-28 NOTE — Telephone Encounter (Signed)
 Patient calls nurse line requesting refill on Furosemide with increased quantity.   Last rx was written for daily tablet as needed, with quantity of 12.   He has not taken medication in about 3-4 weeks.   He reports BLE edema and intermittent episodes of slight SHOB. Denies chest pain. Advised patient that I would recommend him being seen in the office for further evaluation,   Patient states that he will have to call back to office to schedule, as he has issues with transportation.   ED precautions discussed. Rx refill request sent to PCP.   Veronda Prude, RN

## 2023-12-29 MED ORDER — FUROSEMIDE 20 MG PO TABS: ORAL_TABLET | ORAL | 0 refills | Status: DC

## 2023-12-30 ENCOUNTER — Ambulatory Visit: Admitting: Family Medicine

## 2024-01-03 ENCOUNTER — Encounter: Payer: Self-pay | Admitting: Student

## 2024-01-03 ENCOUNTER — Ambulatory Visit (INDEPENDENT_AMBULATORY_CARE_PROVIDER_SITE_OTHER): Admitting: Student

## 2024-01-03 VITALS — BP 172/82 | HR 98 | Ht 72.0 in | Wt 292.2 lb

## 2024-01-03 DIAGNOSIS — I5032 Chronic diastolic (congestive) heart failure: Secondary | ICD-10-CM

## 2024-01-03 DIAGNOSIS — E118 Type 2 diabetes mellitus with unspecified complications: Secondary | ICD-10-CM | POA: Diagnosis not present

## 2024-01-03 DIAGNOSIS — Z7984 Long term (current) use of oral hypoglycemic drugs: Secondary | ICD-10-CM | POA: Diagnosis not present

## 2024-01-03 DIAGNOSIS — Z1211 Encounter for screening for malignant neoplasm of colon: Secondary | ICD-10-CM

## 2024-01-03 DIAGNOSIS — I10 Essential (primary) hypertension: Secondary | ICD-10-CM

## 2024-01-03 LAB — POCT GLYCOSYLATED HEMOGLOBIN (HGB A1C): HbA1c, POC (controlled diabetic range): 6.5 % (ref 0.0–7.0)

## 2024-01-03 MED ORDER — LISINOPRIL 40 MG PO TABS: 40.0000 mg | ORAL_TABLET | Freq: Every day | ORAL | 3 refills | Status: DC

## 2024-01-03 MED ORDER — FUROSEMIDE 20 MG PO TABS: ORAL_TABLET | ORAL | 0 refills | Status: DC

## 2024-01-03 MED ORDER — SPIRONOLACTONE 25 MG PO TABS
25.0000 mg | ORAL_TABLET | Freq: Every day | ORAL | 3 refills | Status: DC
Start: 2024-01-03 — End: 2024-01-14

## 2024-01-03 NOTE — Patient Instructions (Addendum)
 Pleasure to meet you today.  Your A1c improved to 6.5% today.  Today I refilled your medications including your blood pressure, spironolactone and your fluid pill.  Make sure you schedule an appointment with your eye doctor to do your annual eye exam.  Once that is completed please have them fax the results to our clinic.  Your blood pressure today is elevated.  Please make sure you are taking your blood pressure medications as prescribed.  Check your blood pressure daily and log.  Follow-up with your PCP in 2-3 weeks.  Schedule an appointment to see your PCP in 2-3 weeks to discuss management of your fluid pill.  You are also due for colonoscopy I have put in a referral to see a stomach doctor for your colonoscopy.

## 2024-01-03 NOTE — Progress Notes (Signed)
    SUBJECTIVE:   CHIEF COMPLAINT / HPI:   73 year old male with a history of diabetes, heart failure, and hypertension, presents for a diabetes follow-up and medication refills. He reports being out of BP meds and lasix. He also reports that his furosemide was reduced to 15 tablets, and since then, he has experienced significant leg and foot swelling, to the point of difficulty wearing shoes.  He has not had his diabetes eye exam and is due for a colonoscopy.  Type 2 diabetes A1c 8 months ago 6.9 On metformin 1000 mg BID,  Other meds lisinopril, lipitor 40mg  daily  PERTINENT  PMH / PSH: Reviewed   OBJECTIVE:   BP (!) 172/82   Pulse 98   Ht 6' (1.829 m)   Wt 292 lb 3.2 oz (132.5 kg)   SpO2 98%   BMI 39.63 kg/m    Physical Exam General: Alert, well appearing, NAD Cardiovascular: RRR, No Murmurs, Normal S2/S2 Respiratory: CTAB, No wheezing or Rales Abdomen: No distension or tenderness Extremities: +1 BLE edema.   ASSESSMENT/PLAN:   Essential hypertension BP today elevated x 2.  Patient reports he has been out of his BP meds in the last 2 months.  Suspect elevated BP is most likely due to noncompliance. - Refilled his lisinopril and spironolactone. - Patient encouraged to keep daily BP logs and follow-up with PCP in 2 weeks to reassess.  Type 2 diabetes mellitus with complication, without long-term current use of insulin (HCC) Well-controlled with A1c of 6.5% today.  No hyperglycemic or hypoglycemic episodes. - Ordered A1c, microalbuminuria - Patient encouraged to schedule his annual diabetes eye exam.  Diastolic CHF (HCC) Mild BLE edema on exam.  Endorses been out of his as needed Lasix for few weeks now. - Refilled his Lasix 12 tablets as needed. - Encourage patient to discuss with PCP, Dr. Bernardino Bridge about increasing 20 of Lasix at his follow-up appointment.    Goble Last, MD [facility]

## 2024-01-03 NOTE — Assessment & Plan Note (Signed)
 Well-controlled with A1c of 6.5% today.  No hyperglycemic or hypoglycemic episodes. - Ordered A1c, microalbuminuria - Patient encouraged to schedule his annual diabetes eye exam.

## 2024-01-03 NOTE — Assessment & Plan Note (Signed)
 BP today elevated x 2.  Patient reports he has been out of his BP meds in the last 2 months.  Suspect elevated BP is most likely due to noncompliance. - Refilled his lisinopril and spironolactone. - Patient encouraged to keep daily BP logs and follow-up with PCP in 2 weeks to reassess.

## 2024-01-03 NOTE — Assessment & Plan Note (Signed)
 Mild BLE edema on exam.  Endorses been out of his as needed Lasix for few weeks now. - Refilled his Lasix 12 tablets as needed. - Encourage patient to discuss with PCP, Dr. Bernardino Bridge about increasing 20 of Lasix at his follow-up appointment.

## 2024-01-03 NOTE — Assessment & Plan Note (Deleted)
 Mild BLE edema on exam.  Endorses been out of his as needed Lasix for few weeks now. - Refilled his Lasix 12 tablets as needed. - Encourage patient to discuss with PCP, Dr. Bernardino Bridge about increasing 20 of Lasix at his follow-up appointment.

## 2024-01-04 LAB — MICROALBUMIN / CREATININE URINE RATIO
Creatinine, Urine: 85.4 mg/dL
Microalb/Creat Ratio: 200 mg/g{creat} — ABNORMAL HIGH (ref 0–29)
Microalbumin, Urine: 170.5 ug/mL

## 2024-01-06 ENCOUNTER — Telehealth: Payer: Self-pay | Admitting: Student

## 2024-01-06 DIAGNOSIS — E118 Type 2 diabetes mellitus with unspecified complications: Secondary | ICD-10-CM

## 2024-01-06 MED ORDER — DAPAGLIFLOZIN PROPANEDIOL 10 MG PO TABS: 10.0000 mg | ORAL_TABLET | Freq: Every day | ORAL | 3 refills | Status: DC

## 2024-01-06 NOTE — Telephone Encounter (Signed)
 Called patient to inform him that his microalbuminuria test showed elevated levels.  Microalbuminuria/creatinine ratio of 200.  Inform patient we will start him on Farxiga which can increase his glucose control caused side effects including increased risk of UTI.  Patient verbalized understanding and agreeable to plan.

## 2024-01-14 ENCOUNTER — Ambulatory Visit (INDEPENDENT_AMBULATORY_CARE_PROVIDER_SITE_OTHER): Admitting: Student

## 2024-01-14 ENCOUNTER — Encounter: Payer: Self-pay | Admitting: Student

## 2024-01-14 VITALS — BP 152/82 | HR 100 | Ht 72.0 in | Wt 290.5 lb

## 2024-01-14 DIAGNOSIS — E118 Type 2 diabetes mellitus with unspecified complications: Secondary | ICD-10-CM

## 2024-01-14 DIAGNOSIS — Z7984 Long term (current) use of oral hypoglycemic drugs: Secondary | ICD-10-CM | POA: Diagnosis not present

## 2024-01-14 DIAGNOSIS — I10 Essential (primary) hypertension: Secondary | ICD-10-CM | POA: Diagnosis not present

## 2024-01-14 DIAGNOSIS — I5032 Chronic diastolic (congestive) heart failure: Secondary | ICD-10-CM

## 2024-01-14 MED ORDER — FUROSEMIDE 20 MG PO TABS: ORAL_TABLET | ORAL | 0 refills | Status: DC

## 2024-01-14 MED ORDER — DAPAGLIFLOZIN PROPANEDIOL 10 MG PO TABS: 10.0000 mg | ORAL_TABLET | Freq: Every day | ORAL | 3 refills | Status: DC

## 2024-01-14 MED ORDER — SPIRONOLACTONE 25 MG PO TABS: 25.0000 mg | ORAL_TABLET | Freq: Every day | ORAL | 3 refills | Status: DC

## 2024-01-14 NOTE — Assessment & Plan Note (Signed)
 Poorly controlled and elevated BP.  Due to noncompliance to medication.  Called patient's pharmacy to confirm patient's medication has been ready for pickup however patient has not picked them up.  Patient advised to His medication pharmacy, keep daily blood pressure logs and follow-up with PCP in 2 weeks to reassess BP.

## 2024-01-14 NOTE — Patient Instructions (Addendum)
 Pleasure to meet you today.  Your blood pressure today was elevated.  Please make sure you are taking your medication as prescribed.  Called your pharmacy who confirmed that your medication is ready for pickup.  Please make sure to go to your pharmacy and pick these medications up.  Phone number for the pharmacy is [phone]  Check your blood pressure daily keep a blood pressure log and follow-up with Dr. Omar Bibber who is your PCP.

## 2024-01-14 NOTE — Progress Notes (Signed)
    SUBJECTIVE:   CHIEF COMPLAINT / HPI:   73 year old male with history of type 2 diabetes and hypertension presenting for blood pressure follow-up.  We have refilled his blood pressure medication including lisinopril  and spironolactone .  Not a time encouraged to keep a blood pressure log until follow-up.  Today patient reports he has not been able to pick up his medication from the pharmacy and haven't been able to check his ambulatory blood pressure.  PERTINENT  PMH / PSH: Reviewed   OBJECTIVE:   BP (!) 152/82   Pulse 100   Ht 6' (1.829 m)   Wt 290 lb 8 oz (131.8 kg)   SpO2 97%   BMI 39.40 kg/m    Physical Exam General: Alert, well appearing, NAD Cardiovascular: RRR, No Murmurs, Normal S2/S2 Respiratory: CTAB, No wheezing or Rales Abdomen: No distension or tenderness Extremities: +1 pitting edema bilaterally. Skin: Warm and dry  ASSESSMENT/PLAN:   Essential hypertension Poorly controlled and elevated BP.  Due to noncompliance to medication.  Called patient's pharmacy to confirm patient's medication has been ready for pickup however patient has not picked them up.  Patient advised to His medication pharmacy, keep daily blood pressure logs and follow-up with PCP in 2 weeks to reassess BP.     Goble Last, MD [facility]

## 2024-01-17 ENCOUNTER — Other Ambulatory Visit: Payer: Self-pay | Admitting: Internal Medicine

## 2024-01-17 DIAGNOSIS — D126 Benign neoplasm of colon, unspecified: Secondary | ICD-10-CM

## 2024-01-24 ENCOUNTER — Other Ambulatory Visit: Payer: Self-pay

## 2024-01-24 ENCOUNTER — Other Ambulatory Visit: Payer: Self-pay | Admitting: Family Medicine

## 2024-01-25 MED ORDER — METFORMIN HCL 1000 MG PO TABS
1000.0000 mg | ORAL_TABLET | Freq: Two times a day (BID) | ORAL | 3 refills | Status: DC
Start: 2024-01-25 — End: 2024-06-09

## 2024-01-25 MED ORDER — DICLOFENAC SODIUM 1 % EX GEL: 4.0000 g | Freq: Four times a day (QID) | CUTANEOUS | 11 refills | Status: DC

## 2024-01-28 ENCOUNTER — Telehealth: Payer: Self-pay

## 2024-01-28 MED ORDER — DICLOFENAC SODIUM 1 % EX GEL: 4.0000 g | Freq: Four times a day (QID) | CUTANEOUS | 11 refills | Status: DC

## 2024-01-28 NOTE — Telephone Encounter (Signed)
 Patient calls nurse line in regards to Voltaren  Gel.   He reports he is no longer using [facility] [address].   He has switched to [facility] on [address]. I have updated this.   He reports they will not transfer medication for him.   Will resend to [facility] on [address].

## 2024-02-08 ENCOUNTER — Other Ambulatory Visit: Payer: Self-pay | Admitting: Student

## 2024-02-08 DIAGNOSIS — I5032 Chronic diastolic (congestive) heart failure: Secondary | ICD-10-CM

## 2024-02-16 ENCOUNTER — Encounter: Payer: Self-pay | Admitting: Family Medicine

## 2024-03-21 ENCOUNTER — Ambulatory Visit: Admitting: Family Medicine

## 2024-03-30 ENCOUNTER — Encounter: Payer: Self-pay | Admitting: Podiatry

## 2024-03-30 ENCOUNTER — Ambulatory Visit (INDEPENDENT_AMBULATORY_CARE_PROVIDER_SITE_OTHER): Admitting: Podiatry

## 2024-03-30 DIAGNOSIS — M79675 Pain in left toe(s): Secondary | ICD-10-CM | POA: Diagnosis not present

## 2024-03-30 DIAGNOSIS — M79674 Pain in right toe(s): Secondary | ICD-10-CM | POA: Diagnosis not present

## 2024-03-30 DIAGNOSIS — E118 Type 2 diabetes mellitus with unspecified complications: Secondary | ICD-10-CM

## 2024-03-30 DIAGNOSIS — B351 Tinea unguium: Secondary | ICD-10-CM | POA: Diagnosis not present

## 2024-03-30 NOTE — Progress Notes (Signed)
 This patient returns to my office for at risk foot care.  This patient requires this care by a professional since this patient will be at risk due to having diabetes.  This patient is unable to cut nails himself since the patient cannot reach his nails.These nails are painful walking and wearing shoes.  This patient presents for at risk foot care today.Seth Sanders  He was last seen in 2023.  General Appearance  Alert, conversant and in no acute stress.  Vascular  Dorsalis pedis and posterior tibial  pulses are absent right foot.  DP palpable left foot.palpable  bilaterally.  Capillary return is within normal limits  bilaterally. Temperature is within normal limits  bilaterally.  Neurologic  Senn-Weinstein monofilament wire test diminished   bilaterally. Muscle power within normal limits bilaterally.  Nails Thick disfigured discolored nails with subungual debris  from hallux to fifth toes bilaterally. No evidence of bacterial infection or drainage bilaterally.  Orthopedic  No limitations of motion  feet .  No crepitus or effusions noted.  No bony pathology or digital deformities noted. HAV  B/L.  Skin  normotropic skin with no porokeratosis noted bilaterally.  No signs of infections or ulcers noted.     Onychomycosis  Pain in right toes  Pain in left toes  Consent was obtained for treatment procedures.   Mechanical debridement of nails 1-5  bilaterally performed with a nail nipper.  Filed with dremel without incident.    Return office visit      3 months                Told patient to return for periodic foot care and evaluation due to potential at risk complications.   Cordella Bold DPM

## 2024-04-03 ENCOUNTER — Other Ambulatory Visit: Payer: Self-pay | Admitting: Family Medicine

## 2024-04-03 DIAGNOSIS — I10 Essential (primary) hypertension: Secondary | ICD-10-CM

## 2024-04-20 ENCOUNTER — Other Ambulatory Visit: Payer: Self-pay

## 2024-04-20 DIAGNOSIS — G8929 Other chronic pain: Secondary | ICD-10-CM

## 2024-04-24 MED ORDER — BACLOFEN 10 MG PO TABS: 10.0000 mg | ORAL_TABLET | Freq: Three times a day (TID) | ORAL | 0 refills | Status: DC | PRN

## 2024-05-23 ENCOUNTER — Other Ambulatory Visit: Payer: Self-pay

## 2024-05-23 DIAGNOSIS — M545 Low back pain, unspecified: Secondary | ICD-10-CM

## 2024-05-23 MED ORDER — BACLOFEN 10 MG PO TABS: 10.0000 mg | ORAL_TABLET | Freq: Three times a day (TID) | ORAL | 0 refills | Status: DC | PRN

## 2024-05-29 ENCOUNTER — Telehealth: Payer: Self-pay

## 2024-05-29 MED ORDER — DICLOFENAC SODIUM 1 % EX GEL: 4.0000 g | Freq: Four times a day (QID) | CUTANEOUS | 11 refills | Status: DC

## 2024-05-29 NOTE — Telephone Encounter (Signed)
 Patient calls nurse line regarding prescription refills for baclofen  and voltaren  gel.   Previous voltaren  gel prescription was [facility] in [place], [place] .   [facility]ent to local [facility].   Patient is calling pharmacy regarding baclofen , as he has not picked up prescription from 05/23/24.  Advised patient of need for appointment prior to future refills. Patient reports difficulty in coming into the office for follow up, as he has to walk a block to get to the bus. He is asking if appointment can be virtual. Will forward to Dr. Lafe.   Please advise.   Chiquita JAYSON English, RN

## 2024-05-30 NOTE — Telephone Encounter (Signed)
 Called patient. Scheduled for next available appointment with Dr. Lafe.   06/09/24 at 1:55.  Patient appreciative.   Chiquita JAYSON English, RN

## 2024-06-09 ENCOUNTER — Other Ambulatory Visit (HOSPITAL_COMMUNITY): Payer: Self-pay

## 2024-06-09 ENCOUNTER — Ambulatory Visit: Admitting: Family Medicine

## 2024-06-09 ENCOUNTER — Other Ambulatory Visit: Payer: Self-pay

## 2024-06-09 DIAGNOSIS — E118 Type 2 diabetes mellitus with unspecified complications: Secondary | ICD-10-CM

## 2024-06-09 DIAGNOSIS — M545 Low back pain, unspecified: Secondary | ICD-10-CM

## 2024-06-09 DIAGNOSIS — Z0000 Encounter for general adult medical examination without abnormal findings: Secondary | ICD-10-CM

## 2024-06-09 DIAGNOSIS — I10 Essential (primary) hypertension: Secondary | ICD-10-CM

## 2024-06-09 DIAGNOSIS — G8929 Other chronic pain: Secondary | ICD-10-CM

## 2024-06-09 DIAGNOSIS — R634 Abnormal weight loss: Secondary | ICD-10-CM

## 2024-06-09 MED ORDER — METFORMIN HCL 1000 MG PO TABS
1000.0000 mg | ORAL_TABLET | Freq: Two times a day (BID) | ORAL | 3 refills | Status: AC
  Filled 2024-06-09: qty 180, 90d supply, fill #0

## 2024-06-09 MED ORDER — ATORVASTATIN CALCIUM 40 MG PO TABS
40.0000 mg | ORAL_TABLET | Freq: Every day | ORAL | 3 refills | Status: DC
  Filled 2024-06-09: qty 90, 90d supply, fill #0

## 2024-06-09 MED ORDER — ACCU-CHEK AVIVA PLUS VI STRP
ORAL_STRIP | 3 refills | Status: DC
  Filled 2024-06-09: qty 100, 50d supply, fill #0

## 2024-06-09 MED ORDER — ALLOPURINOL 300 MG PO TABS
150.0000 mg | ORAL_TABLET | Freq: Every day | ORAL | 3 refills | Status: AC
  Filled 2024-06-09: qty 45, 90d supply, fill #0

## 2024-06-09 MED ORDER — TRAMADOL HCL 50 MG PO TABS
50.0000 mg | ORAL_TABLET | Freq: Two times a day (BID) | ORAL | 0 refills | Status: AC | PRN
  Filled 2024-06-09: qty 14, 7d supply, fill #0

## 2024-06-09 MED ORDER — BACLOFEN 10 MG PO TABS
10.0000 mg | ORAL_TABLET | Freq: Three times a day (TID) | ORAL | 0 refills | Status: DC | PRN
  Filled 2024-06-09: qty 14, 5d supply, fill #0

## 2024-06-09 MED ORDER — ACCU-CHEK SOFTCLIX LANCETS MISC
12 refills | Status: AC
  Filled 2024-06-09: qty 100, 50d supply, fill #0

## 2024-06-09 MED ORDER — DAPAGLIFLOZIN PROPANEDIOL 10 MG PO TABS
10.0000 mg | ORAL_TABLET | Freq: Every day | ORAL | 11 refills | Status: AC
  Filled 2024-06-09: qty 30, 30d supply, fill #0

## 2024-06-09 MED ORDER — SPIRONOLACTONE 25 MG PO TABS
25.0000 mg | ORAL_TABLET | Freq: Every day | ORAL | 3 refills | Status: DC
  Filled 2024-06-09: qty 90, 90d supply, fill #0

## 2024-06-09 MED ORDER — AMLODIPINE BESYLATE 10 MG PO TABS
10.0000 mg | ORAL_TABLET | Freq: Every day | ORAL | 3 refills | Status: DC
  Filled 2024-06-09: qty 90, 90d supply, fill #0

## 2024-06-09 MED ORDER — LISINOPRIL 40 MG PO TABS
40.0000 mg | ORAL_TABLET | Freq: Every day | ORAL | 3 refills | Status: DC
  Filled 2024-06-09: qty 90, 90d supply, fill #0

## 2024-06-09 MED ORDER — ACETAMINOPHEN ER 650 MG PO TBCR
650.0000 mg | EXTENDED_RELEASE_TABLET | Freq: Three times a day (TID) | ORAL | 1 refills | Status: AC | PRN
  Filled 2024-06-09: qty 90, 30d supply, fill #0

## 2024-06-09 MED ORDER — DICLOFENAC SODIUM 1 % EX GEL
4.0000 g | Freq: Four times a day (QID) | CUTANEOUS | 11 refills | Status: DC
  Filled 2024-06-09: qty 300, 18d supply, fill #0

## 2024-06-09 MED ORDER — GABAPENTIN 300 MG PO CAPS
300.0000 mg | ORAL_CAPSULE | Freq: Three times a day (TID) | ORAL | 11 refills | Status: AC
  Filled 2024-06-09: qty 90, 30d supply, fill #0

## 2024-06-09 NOTE — Progress Notes (Signed)
 [facility] Telemedicine Visit  Patient consented to have virtual visit and was identified by name and date of birth. Method of visit: Telephone  Encounter participants: Patient: Seth Sanders - located at home Provider: Lauraine Norse - located at [facility] Others (if applicable): none  Chief Complaint: chronic health problems follow up  HPI:  HTN Currently taking amlodipine  10 mg daily, lisinopril  40 mg daily, spironolactone  25 mg daily.  He does not check his pressures at home.  T2DM Currently taking metformin  1000 mg BID and Farxiga  10 mg daily. He does check his sugars at home. He reports fasting around 100, 93 in the evening. Tells me he is eating a good diet.  Weight loss When I saw pt last Fall I was very concerned by rapid weight loss of 13 lbs over 1 month. At the time patient attributed this to cutting out fried foods and otherwise improving his diet. Given smoking history urged patient to have CT lung cancer screening, however this was unfortunately not done. Did check PSA at the time which was WNL. Fortunately weight trend at that time has showed weight essentially stable over the past year - this is reassuring, though today he reports the following: Weighs 182 lbs 2 weeks ago he says, checked at home on home scale. In our system he weighed 290 lbs in April 2025. Denies food insecurity, needing new clothes.  Continues to take atorvastatin  40 mg daily. No recent gout flares; take allopurinol  150 mg daily.  Patient has chronic back pain; for this patient already has tramadol , gabapentin , baclofen , voltaren  gel, lidocaine  patches.   ROS: per HPI  Pertinent PMHx: T2DM, HTN, CHF, Gout, HLD, ?weight loss  Exam:  There were no vitals taken for this visit.  Respiratory: Speaks in complete sentences without respiratory distress, no wheezing audible over the phone. Psych: cognition and judgement appear intact.  Assessment/Plan:  Assessment &  Plan Type 2 diabetes mellitus with complication, without long-term current use of insulin (HCC) Controlled, according to pt reported sugars. Need to check labs soon and appt has been made in 1 month. - continue current regimen - hopeful that pt can be seen by Pharmacy, Dr. Koval on same day to help with medication teaching and management Essential hypertension BP not checked in some time; no red flag symptoms reported during phone interview. - continue current regimen; will check BP at next appt - as above, hopefully pt can see Dr. Koval same day for management Chronic midline low back pain without sciatica Stable. Current medications are helping. Hypercalcemia Elevated in the past. Needs PTH at next appt in October. Weight loss Has demonstrated rapid weight loss last year. If his reported weight today is accurate he has lost ~100 lbs since April of this year, which is very concerning. Given denial of food insecurity and needing new clothes I question if this weight is accurate or not. Either way he does need to be seen soon and has f/u scheduled. - Needs CT scan to evaluate for malignancy, especially in light of smoking history Healthcare maintenance He has significant trouble attending appts, picking up medications from pharmacy, etc due to transportation barriers and difficult ambulation. - sent rx to [facility] in hopes pt can have these mailed to his home; phone number and instructions on contacting pharmacy to set this up discussed with pt today. - he may be a good candidate for home visits; will discuss with clinic staff to try and schedule. - [facility] referral  to aid with transportation - at next appt needs CBC, CMP, lipid panel, PTH, PSA. Future lab orders placed today.   Time spent during visit with patient: 12 minutes  Lauraine Norse, DO [facility], PGY-2 06/09/24 3:00 PM

## 2024-06-09 NOTE — Assessment & Plan Note (Signed)
 Elevated in the past. Needs PTH at next appt in October.

## 2024-06-09 NOTE — Assessment & Plan Note (Signed)
 Has demonstrated rapid weight loss last year. If his reported weight today is accurate he has lost ~100 lbs since April of this year, which is very concerning. Given denial of food insecurity and needing new clothes I question if this weight is accurate or not. Either way he does need to be seen soon and has f/u scheduled. - Needs CT scan to evaluate for malignancy, especially in light of smoking history

## 2024-06-09 NOTE — Assessment & Plan Note (Signed)
 Stable. Current medications are helping.

## 2024-06-09 NOTE — Assessment & Plan Note (Addendum)
 He has significant trouble attending appts, picking up medications from pharmacy, etc due to transportation barriers and difficult ambulation. - sent rx to [facility] in hopes pt can have these mailed to his home; phone number and instructions on contacting pharmacy to set this up discussed with pt today. - he may be a good candidate for home visits; will discuss with clinic staff to try and schedule. - [facility] referral to aid with transportation - at next appt needs CBC, CMP, lipid panel, PTH, PSA. Future lab orders placed today.

## 2024-06-09 NOTE — Assessment & Plan Note (Addendum)
 Controlled, according to pt reported sugars. Need to check labs soon and appt has been made in 1 month. - continue current regimen - hopeful that pt can be seen by Pharmacy, Dr. Koval on same day to help with medication teaching and management

## 2024-06-09 NOTE — Assessment & Plan Note (Addendum)
 BP not checked in some time; no red flag symptoms reported during phone interview. - continue current regimen; will check BP at next appt - as above, hopefully pt can see Dr. Koval same day for management

## 2024-06-13 ENCOUNTER — Telehealth: Payer: Self-pay | Admitting: Pharmacist

## 2024-06-13 ENCOUNTER — Telehealth: Payer: Self-pay | Admitting: *Deleted

## 2024-06-13 NOTE — Progress Notes (Unsigned)
 Complex Care Management Note Care Guide Note  06/13/2024 Name: Seth Sanders MRN: [id number] DOB: 08/27/1951   Complex Care Management Outreach Attempts: An unsuccessful telephone outreach was attempted today to offer the patient information about available complex care management services.  Follow Up Plan:  Additional outreach attempts will be made to offer the patient complex care management information and services.   Encounter Outcome:  No Answer [name]  [facility]  [facility], [facility] Guide  Direct Dial: [phone]  Fax [phone]

## 2024-06-13 NOTE — Telephone Encounter (Signed)
 Attempted to contact patient for follow-up of appointment for DM & hypertension per Dr. Lafe.    Left HIPAA compliant voice mail requesting call back to direct phone: [phone]  Total time with patient call and documentation of interaction: 2 minutes.

## 2024-06-14 ENCOUNTER — Other Ambulatory Visit: Payer: Self-pay

## 2024-06-14 DIAGNOSIS — M545 Low back pain, unspecified: Secondary | ICD-10-CM

## 2024-06-14 NOTE — Progress Notes (Signed)
 Complex Care Management Note  Care Guide Note 06/14/2024 Name: Seth Sanders MRN: [id number] DOB: 04/11/1951  Seth Sanders is a 73 y.o. year old male who sees Lafe Domino, DO for primary care. I reached out to Seth Sanders by phone today to offer complex care management services.  Seth Sanders was given information about Complex Care Management services today including:   The Complex Care Management services include support from the care team which includes your Nurse Care Manager, Clinical Social Worker, or Pharmacist.  The Complex Care Management team is here to help remove barriers to the health concerns and goals most important to you. Complex Care Management services are voluntary, and the patient may decline or stop services at any time by request to their care team member.   Complex Care Management Consent Status: Patient did not agree to participate in complex care management services at this time.  Follow up plan:  None  Encounter Outcome:  Patient Refused to schedule at this time   [facility]  [facility], [facility] Guide  Direct Dial: [phone]  Fax [phone]

## 2024-06-14 NOTE — Progress Notes (Signed)
 Complex Care Management Note Care Guide Note  06/14/2024 Name: Seth Sanders MRN: [id number] DOB: Aug 19, 1951   Complex Care Management Outreach Attempts: A second unsuccessful outreach was attempted today to offer the patient with information about available complex care management services.  Follow Up Plan:  Additional outreach attempts will be made to offer the patient complex care management information and services.   Encounter Outcome:  No Answer  [name]  [facility]  [facility], [facility] Guide  Direct Dial: [phone]  Fax [phone]

## 2024-06-15 ENCOUNTER — Other Ambulatory Visit (HOSPITAL_COMMUNITY): Payer: Self-pay

## 2024-06-15 ENCOUNTER — Other Ambulatory Visit: Payer: Self-pay | Admitting: Family Medicine

## 2024-06-15 DIAGNOSIS — G8929 Other chronic pain: Secondary | ICD-10-CM

## 2024-06-15 MED ORDER — BACLOFEN 10 MG PO TABS
10.0000 mg | ORAL_TABLET | Freq: Three times a day (TID) | ORAL | 1 refills | Status: DC | PRN
  Filled 2024-06-15: qty 60, 20d supply, fill #0

## 2024-06-15 NOTE — Telephone Encounter (Signed)
 Refilled. Have discussed with pt that this should not be a long-term daily medication but rather available for a few days at a time as needed for muscle spasm/pain. Will continue to discuss this at next appt; if he is still having significant discomfort then other treatment options will need to be explored.  Next appt with me 10/17. Patient should be seen sooner if concerns continue.

## 2024-06-15 NOTE — Telephone Encounter (Signed)
 Reviewed and agree with Dr Rennis plan.

## 2024-06-19 ENCOUNTER — Other Ambulatory Visit (HOSPITAL_COMMUNITY): Payer: Self-pay

## 2024-06-19 ENCOUNTER — Telehealth: Payer: Self-pay | Admitting: Pharmacist

## 2024-06-19 NOTE — Telephone Encounter (Signed)
-----   Message from Lauraine Norse sent at 06/09/2024  2:48 PM EDT ----- Regarding: scheduling issues This pt is scheduled to see me Friday 10/17 8:30AM. I was trying to add him to your schedule that morning as well to discuss his T2DM, HTN but system would not let me.  Can you schedule him? I put in the [facility] pharmacy referral :)  Thanks! Lauraine

## 2024-06-19 NOTE — Telephone Encounter (Signed)
 Patient contacted for follow-up of medication management  Asked by Dr. Lafe to contact patient to schedule a visit for medication management. Patient reports wanting to get through visit with Dr. Lafe on 07/07/24 before hanging up the phone.   Total time with patient call and documentation of interaction: 2 minutes.  Follow up to be scheduled at visit Dr. Lafe

## 2024-06-19 NOTE — Telephone Encounter (Signed)
 Reviewed and agree with Dr Rennis plan.

## 2024-06-26 ENCOUNTER — Telehealth: Payer: Self-pay

## 2024-06-26 ENCOUNTER — Other Ambulatory Visit (HOSPITAL_COMMUNITY): Payer: Self-pay

## 2024-06-26 ENCOUNTER — Telehealth: Payer: Self-pay | Admitting: Family Medicine

## 2024-06-26 DIAGNOSIS — I10 Essential (primary) hypertension: Secondary | ICD-10-CM

## 2024-06-26 DIAGNOSIS — G8929 Other chronic pain: Secondary | ICD-10-CM

## 2024-06-26 MED ORDER — AMLODIPINE BESYLATE 10 MG PO TABS
10.0000 mg | ORAL_TABLET | Freq: Every day | ORAL | 3 refills | Status: AC
Start: 2024-06-26 — End: ?

## 2024-06-26 MED ORDER — LISINOPRIL 40 MG PO TABS: 40.0000 mg | ORAL_TABLET | Freq: Every day | ORAL | 3 refills | Status: AC

## 2024-06-26 MED ORDER — SPIRONOLACTONE 25 MG PO TABS
25.0000 mg | ORAL_TABLET | Freq: Every day | ORAL | 3 refills | Status: AC
Start: 2024-06-26 — End: ?

## 2024-06-26 MED ORDER — BACLOFEN 10 MG PO TABS: 10.0000 mg | ORAL_TABLET | Freq: Three times a day (TID) | ORAL | 1 refills | Status: AC | PRN

## 2024-06-26 NOTE — Telephone Encounter (Signed)
 Open in error

## 2024-06-26 NOTE — Telephone Encounter (Signed)
 Patient calls nurse line requesting refills on BP medications and baclofen .   Advised that these had been sent to [facility]. Patient states that he does not use this pharmacy and would like prescriptions sent to [facility] on [address].   Called and cancelled at [facility] pharmacy and resent to [facility] on [address].   Chiquita JAYSON English, RN

## 2024-06-30 ENCOUNTER — Ambulatory Visit: Admitting: Podiatry

## 2024-07-03 ENCOUNTER — Other Ambulatory Visit: Payer: Self-pay | Admitting: Family Medicine

## 2024-07-06 NOTE — Progress Notes (Signed)
    SUBJECTIVE:   CHIEF COMPLAINT / HPI:   HTN He is currently prescribed amlodipine  10 mg, lisinopril  40 mg, spironolactone  25 mg. He is taking these daily for the last 2 weeks, but prior to that was out of meds. No CP, SoB.  Very occasional dizziness when he stands up quickly, but this resolves.  T2DM He checks at home every now and then, usually 110s. He is currently prescribed farxiga  10 mg, metformin  1000 mg BID. He is taking metformin , but he is not sure if he is taking the Farxiga  or not. Also has gabapentin  300 mg TID for neuropathy, which works well for him.  Weight loss 290lbs in April, 268lbs in clinic today (-7.5%). Diet - good appetite, doesn't like cooking; no belly pain, N/V, constipation. Has not had to buy new clothes to fit him.  He tells me he had Cologuard some years ago, normal results.  Unfortunately we do not have record of this.  PERTINENT  PMH / PSH: Reviewed. HTN, T2DM, unintentional weight loss, chronic back pain  OBJECTIVE:   BP (!) 152/80   Pulse 80   Ht 6' (1.829 m)   Wt 268 lb 12.8 oz (121.9 kg)   SpO2 97%   BMI 36.46 kg/m   General: Well-appearing, no acute distress. HEENT: normocephalic, PERRLA, EOM grossly intact, MMM Cardio: Regular rate, regular rhythm. Murmur heard. Pulm: Clear, no wheezing, no crackles. No increased work of breathing. Abdominal: bowel sounds present, soft, non-tender, non-distended. Extremities: no peripheral edema. Moves all extremities equally. Neuro: Alert and oriented x3, speech normal in content, no facial asymmetry Psych:  Cognition and judgment appear intact. Alert, communicative, and cooperative   ASSESSMENT/PLAN:   Assessment & Plan Type 2 diabetes mellitus with complication, without long-term current use of insulin (HCC) Controlled - A1c today 6.7.  Unclear if he has been taking Farxiga  10 mg daily or not. - Continue metformin  1000 mg twice daily. - In the setting of unexplained weight loss Farxiga  may  not be the best medication for him to continue; will continue to monitor and appreciate input from Dr. Koval - Follow-up scheduled with pharmacist Dr. Koval in 2 weeks to discuss medication regimen Essential hypertension Controlled.  Continue medication regimen as above.  Discussed slowly changing positions to help prevent dizziness. Unexplained weight loss Significant weight loss over the last year or so.  Given his history of smoking I do have concern for malignancy.  Long conversation had today about the importance of screening, however he tells me I would rather just let it sneak up on me. - Strongly recommended CT lung cancer screening; patient is very anxious about this, and declines at this time - Strongly recommended colonoscopy; patient declines at this time.  On further conversation he is agreeable to Cologuard. - Lab work ordered today to further assess - Plan for regular follow-up at minimum interval of every 3 months to monitor his weight; hopefully this will stabilize and labs will be reassuring. - Continue discussion of importance of cancer screenings at each visit.   Lauraine Norse, DO [facility]

## 2024-07-07 ENCOUNTER — Ambulatory Visit: Admitting: Family Medicine

## 2024-07-07 ENCOUNTER — Ambulatory Visit (INDEPENDENT_AMBULATORY_CARE_PROVIDER_SITE_OTHER): Payer: Self-pay | Admitting: Family Medicine

## 2024-07-07 ENCOUNTER — Encounter: Payer: Self-pay | Admitting: Family Medicine

## 2024-07-07 VITALS — BP 134/84 | HR 80 | Ht 72.0 in | Wt 268.8 lb

## 2024-07-07 DIAGNOSIS — Z1211 Encounter for screening for malignant neoplasm of colon: Secondary | ICD-10-CM

## 2024-07-07 DIAGNOSIS — R634 Abnormal weight loss: Secondary | ICD-10-CM | POA: Diagnosis not present

## 2024-07-07 DIAGNOSIS — I10 Essential (primary) hypertension: Secondary | ICD-10-CM

## 2024-07-07 DIAGNOSIS — E118 Type 2 diabetes mellitus with unspecified complications: Secondary | ICD-10-CM | POA: Diagnosis not present

## 2024-07-07 LAB — POCT GLYCOSYLATED HEMOGLOBIN (HGB A1C): HbA1c, POC (controlled diabetic range): 6.7 % (ref 0.0–7.0)

## 2024-07-07 MED ORDER — DICLOFENAC SODIUM 1 % EX GEL: 4.0000 g | Freq: Four times a day (QID) | CUTANEOUS | 11 refills | Status: AC

## 2024-07-07 NOTE — Patient Instructions (Signed)
 Keep taking all of your medications as prescribed.  You have an appointment with Dr. Koval coming up - please bring all of your medicines with you to that visit.  I have ordered Cologuard; this will be mailed to your house.

## 2024-07-07 NOTE — Assessment & Plan Note (Signed)
 Controlled - A1c today 6.7.  Unclear if he has been taking Farxiga  10 mg daily or not. - Continue metformin  1000 mg twice daily. - In the setting of unexplained weight loss Farxiga  may not be the best medication for him to continue; will continue to monitor and appreciate input from Dr. Koval - Follow-up scheduled with pharmacist Dr. Koval in 2 weeks to discuss medication regimen

## 2024-07-07 NOTE — Assessment & Plan Note (Signed)
 Controlled.  Continue medication regimen as above.  Discussed slowly changing positions to help prevent dizziness.

## 2024-07-13 LAB — PTH, INTACT AND CALCIUM
Calcium: 10.6 mg/dL — ABNORMAL HIGH (ref 8.6–10.2)
PTH: 39 pg/mL (ref 15–65)

## 2024-07-13 LAB — MULTIPLE MYELOMA PANEL, SERUM
Albumin SerPl Elph-Mcnc: 3.9 g/dL (ref 2.9–4.4)
Albumin/Glob SerPl: 1.2 (ref 0.7–1.7)
Alpha 1: 0.3 g/dL (ref 0.0–0.4)
Alpha2 Glob SerPl Elph-Mcnc: 0.9 g/dL (ref 0.4–1.0)
B-Globulin SerPl Elph-Mcnc: 1.1 g/dL (ref 0.7–1.3)
Gamma Glob SerPl Elph-Mcnc: 1 g/dL (ref 0.4–1.8)
Globulin, Total: 3.3 g/dL (ref 2.2–3.9)
IgA/Immunoglobulin A, Serum: 91 mg/dL (ref 61–437)
IgG (Immunoglobin G), Serum: 1166 mg/dL (ref 603–1613)
IgM (Immunoglobulin M), Srm: 45 mg/dL (ref 15–143)
Total Protein: 7.2 g/dL (ref 6.0–8.5)

## 2024-07-13 LAB — CBC
Hematocrit: 48.5 % (ref 37.5–51.0)
Hemoglobin: 15.4 g/dL (ref 13.0–17.7)
MCH: 29.6 pg (ref 26.6–33.0)
MCHC: 31.8 g/dL (ref 31.5–35.7)
MCV: 93 fL (ref 79–97)
Platelets: 161 x10E3/uL (ref 150–450)
RBC: 5.21 x10E6/uL (ref 4.14–5.80)
RDW: 13.1 % (ref 11.6–15.4)
WBC: 7.6 x10E3/uL (ref 3.4–10.8)

## 2024-07-13 LAB — TSH: TSH: 1.81 u[IU]/mL (ref 0.450–4.500)

## 2024-07-13 LAB — AMMONIA: Ammonia: 44 ug/dL (ref 31–169)

## 2024-07-13 LAB — LACTATE DEHYDROGENASE: LDH: 164 IU/L (ref 121–224)

## 2024-07-13 LAB — C-REACTIVE PROTEIN: CRP: 1 mg/L (ref 0–10)

## 2024-07-13 LAB — SEDIMENTATION RATE: Sed Rate: 23 mm/h (ref 0–30)

## 2024-07-13 LAB — HIV ANTIBODY (ROUTINE TESTING W REFLEX): HIV Screen 4th Generation wRfx: NONREACTIVE

## 2024-07-13 LAB — FERRITIN: Ferritin: 144 ng/mL (ref 30–400)

## 2024-07-13 LAB — HEPATITIS C ANTIBODY: Hep C Virus Ab: NONREACTIVE

## 2024-07-14 ENCOUNTER — Ambulatory Visit: Payer: Self-pay | Admitting: Family Medicine

## 2024-07-14 DIAGNOSIS — R634 Abnormal weight loss: Secondary | ICD-10-CM

## 2024-07-20 ENCOUNTER — Ambulatory Visit: Admitting: Pharmacist

## 2024-08-07 ENCOUNTER — Telehealth: Payer: Self-pay | Admitting: Pharmacist

## 2024-08-07 NOTE — Telephone Encounter (Signed)
 Attempted to contact patient for follow-up of scheduling appointment with PCP for DM QI'25.    Left HIPAA compliant voice mail requesting call back to direct phone: [phone]  Total time with patient call and documentation of interaction: 3 minutes.

## 2024-08-16 NOTE — Progress Notes (Signed)
 BRENDAN GADSON                                          MRN: [id number]   08/16/2024   The VBCI Quality Team Specialist reviewed this patient medical record for the purposes of chart review for care gap closure. The following were reviewed: chart review for care gap closure-kidney health evaluation for diabetes:eGFR  and uACR.    VBCI Quality Team

## 2024-08-24 ENCOUNTER — Encounter

## 2024-09-05 NOTE — Progress Notes (Signed)
 DRAGO HAMMONDS                                          MRN: [id number]   09/05/2024   The VBCI Quality Team Specialist reviewed this patient medical record for the purposes of chart review for care gap closure. The following were reviewed: chart review for care gap closure-kidney health evaluation for diabetes:eGFR  and uACR.    VBCI Quality Team

## 2024-09-25 ENCOUNTER — Telehealth: Payer: Self-pay

## 2024-09-25 NOTE — Telephone Encounter (Signed)
-----   Message from Lauraine Norse sent at 06/09/2024  2:42 PM EDT ----- Regarding: Geri/home visits Hi! This patient has transportation barriers and would likely be a good candidate for Geri clinic and/or home visits. Do you have any availability for him coming up?  Thanks! Lauraine

## 2024-09-26 ENCOUNTER — Telehealth: Payer: Self-pay

## 2024-09-26 ENCOUNTER — Other Ambulatory Visit: Payer: Self-pay | Admitting: Family Medicine

## 2024-09-26 MED ORDER — ACCU-CHEK AVIVA PLUS W/DEVICE KIT: PACK | 0 refills | Status: DC

## 2024-09-26 NOTE — Telephone Encounter (Signed)
 Patient calls nurse line requesting a new DM meter.   He reports his is no longer working and reports an error code. He reports he has tried changing the batteries without success.   He reports he uses [employer] supplies.  Patient scheduled for DM FU with PCP for 2/4.  Will forward to PCP to send in new DM meter.

## 2024-09-28 MED ORDER — ACCU-CHEK GUIDE TEST VI STRP: ORAL_STRIP | 12 refills | Status: AC

## 2024-09-28 MED ORDER — ACCU-CHEK GUIDE W/DEVICE KIT: PACK | 0 refills | Status: AC

## 2024-09-28 NOTE — Addendum Note (Signed)
 Addended by: GLENDIA DAMIEN SQUIBB on: 09/28/2024 11:31 AM   Modules accepted: Orders

## 2024-09-28 NOTE — Telephone Encounter (Signed)
 Patient calls nurse line again in regards to glucose monitor.   He reports they no longer make this meter. He reports the accu chek guide will need to be sent in, along with accu chek test strips.   Will send in now.

## 2024-10-04 ENCOUNTER — Encounter: Payer: Self-pay | Admitting: Family Medicine

## 2024-10-04 DIAGNOSIS — Z6836 Body mass index (BMI) 36.0-36.9, adult: Secondary | ICD-10-CM | POA: Insufficient documentation

## 2024-10-05 NOTE — Telephone Encounter (Signed)
 Attempted to reach patient. To see if he was open to doing a Geri home visit with Dr. Keneth . I ask if he will call the office to let me know so that I can set up a day. Seth Sanders, CMA

## 2024-10-09 NOTE — Telephone Encounter (Signed)
-----   Message from Krystal Bale, MD sent at 06/30/2024  8:29 AM EDT ----- Regarding: FW: Geri/home visits Laurey, Could you place Mr Lubitz on the list for [facility] ----- Message ----- From: Seth Domino, DO Sent: 06/09/2024   2:43 PM EDT To: Krystal BIRCH McDiarmid, MD; Nelson Land, CMA Subject: Geri/home visits                               Hi! This patient has transportation barriers and would likely be a good candidate for Geri clinic and/or home visits. Do you have any availability for him coming up?  Thanks! Sanders

## 2024-10-09 NOTE — Telephone Encounter (Signed)
 2nd attempt to reach patient to see if he would be open to doing a home visit with Dr. Keneth for [facility] visit. No answer. LVM for patient to call office to let us  know. Nelson Land, CMA

## 2024-10-24 ENCOUNTER — Other Ambulatory Visit: Payer: Self-pay | Admitting: Family Medicine

## 2024-10-25 ENCOUNTER — Ambulatory Visit: Payer: Self-pay | Admitting: Family Medicine

## 2024-10-26 NOTE — Telephone Encounter (Signed)
 Spoke with patient to see if he was open to doing a Geri home visit. Patient stated that he is not interested in doing a home visit. Nelson Land, CMA
